# Patient Record
Sex: Male | Born: 1965 | Race: White | Hispanic: No | Marital: Married | Smoking: Never smoker
Health system: Southern US, Community
[De-identification: ages and names within clinical notes are randomized; demographics above are authoritative.]

## PROBLEM LIST (undated history)

## (undated) DIAGNOSIS — I10 Essential (primary) hypertension: Secondary | ICD-10-CM

## (undated) DIAGNOSIS — R05 Cough: Secondary | ICD-10-CM

## (undated) DIAGNOSIS — Z87442 Personal history of urinary calculi: Secondary | ICD-10-CM

## (undated) DIAGNOSIS — J45909 Unspecified asthma, uncomplicated: Secondary | ICD-10-CM

## (undated) DIAGNOSIS — F419 Anxiety disorder, unspecified: Secondary | ICD-10-CM

## (undated) DIAGNOSIS — K429 Umbilical hernia without obstruction or gangrene: Secondary | ICD-10-CM

## (undated) DIAGNOSIS — F988 Other specified behavioral and emotional disorders with onset usually occurring in childhood and adolescence: Secondary | ICD-10-CM

## (undated) DIAGNOSIS — K219 Gastro-esophageal reflux disease without esophagitis: Secondary | ICD-10-CM

## (undated) DIAGNOSIS — R059 Cough, unspecified: Secondary | ICD-10-CM

## (undated) HISTORY — PX: CYSTO: SHX6284

## (undated) HISTORY — PX: CARDIAC CATHETERIZATION: SHX172

## (undated) HISTORY — PX: WISDOM TOOTH EXTRACTION: SHX21

## (undated) HISTORY — PX: TONSILLECTOMY: SUR1361

## (undated) HISTORY — PX: COLONOSCOPY: SHX174

---
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
## 2009-03-05 HISTORY — PX: CARDIAC CATHETERIZATION: SHX172

## 2009-09-18 ENCOUNTER — Inpatient Hospital Stay (HOSPITAL_COMMUNITY): Admission: EM | Admit: 2009-09-18 | Discharge: 2009-09-19 | Payer: Self-pay | Admitting: Emergency Medicine

## 2010-05-20 LAB — DIFFERENTIAL
Lymphocytes Relative: 26 % (ref 12–46)
Monocytes Absolute: 0.6 10*3/uL (ref 0.1–1.0)
Monocytes Relative: 7 % (ref 3–12)
Neutro Abs: 5.2 10*3/uL (ref 1.7–7.7)
Neutrophils Relative %: 59 % (ref 43–77)

## 2010-05-20 LAB — HEMOGLOBIN A1C
Hgb A1c MFr Bld: 5.3 % (ref <5.7)
Mean Plasma Glucose: 105 mg/dL (ref <117)
Mean Plasma Glucose: 105 mg/dL (ref <117)

## 2010-05-20 LAB — COMPREHENSIVE METABOLIC PANEL
AST: 45 U/L — ABNORMAL HIGH (ref 0–37)
Alkaline Phosphatase: 48 U/L (ref 39–117)
Calcium: 9.4 mg/dL (ref 8.4–10.5)
GFR calc non Af Amer: >60 mL/min (ref >60)
Sodium: 135 mEq/L (ref 135–145)
Total Protein: 7.5 g/dL (ref 6.0–8.3)

## 2010-05-20 LAB — BASIC METABOLIC PANEL
CO2: 24 mEq/L (ref 19–32)
Calcium: 8.8 mg/dL (ref 8.4–10.5)
Chloride: 107 mEq/L (ref 96–112)
Glucose, Bld: 102 mg/dL — ABNORMAL HIGH (ref 70–99)
Potassium: 4 mEq/L (ref 3.5–5.1)
Sodium: 138 mEq/L (ref 135–145)

## 2010-05-20 LAB — CBC
HCT: 47.1 % (ref 39.0–52.0)
Hemoglobin: 14.6 g/dL (ref 13.0–17.0)
MCHC: 35.3 g/dL (ref 30.0–36.0)
Platelets: 213 10*3/uL (ref 150–400)
RBC: 5.05 MIL/uL (ref 4.22–5.81)
RDW: 13.2 % (ref 11.5–15.5)
WBC: 8.8 10*3/uL (ref 4.0–10.5)

## 2010-05-20 LAB — PROTIME-INR: INR: 0.96 (ref 0.00–1.49)

## 2010-05-20 LAB — CARDIAC PANEL(CRET KIN+CKTOT+MB+TROPI)
CK, MB: 1.4 ng/mL (ref 0.3–4.0)
Relative Index: 0.1 (ref 0.0–2.5)
Relative Index: 0.1 (ref 0.0–2.5)
Total CK: 1802 U/L — ABNORMAL HIGH (ref 7–232)
Troponin I: 0.01 ng/mL (ref 0.00–0.06)
Troponin I: 0.01 ng/mL (ref 0.00–0.06)

## 2010-05-20 LAB — MAGNESIUM: Magnesium: 2.2 mg/dL (ref 1.5–2.5)

## 2010-05-20 LAB — POCT CARDIAC MARKERS
CKMB, poc: 1 ng/mL (ref 1.0–8.0)
CKMB, poc: <1 ng/mL — ABNORMAL LOW (ref 1.0–8.0)
Myoglobin, poc: 310 ng/mL (ref 12–200)
Myoglobin, poc: 373 ng/mL (ref 12–200)
Troponin i, poc: <0.05 ng/mL (ref 0.00–0.09)

## 2010-05-20 LAB — MRSA PCR SCREENING: MRSA by PCR: NEGATIVE

## 2010-05-20 LAB — TROPONIN I: Troponin I: 0.01 ng/mL (ref 0.00–0.06)

## 2010-05-20 LAB — POCT I-STAT, CHEM 8
BUN: 20 mg/dL (ref 6–23)
Calcium, Ion: 1.3 mmol/L (ref 1.12–1.32)
Chloride: 103 mEq/L (ref 96–112)
Creatinine, Ser: 1.1 mg/dL (ref 0.4–1.5)
TCO2: 27 mmol/L (ref 0–100)

## 2010-05-20 LAB — HEPARIN LEVEL (UNFRACTIONATED): Heparin Unfractionated: 0.18 IU/mL — ABNORMAL LOW (ref 0.30–0.70)

## 2010-05-20 LAB — LIPASE, BLOOD: Lipase: 49 U/L (ref 11–59)

## 2010-05-21 ENCOUNTER — Emergency Department (HOSPITAL_COMMUNITY): Payer: Medicaid Other

## 2010-05-21 ENCOUNTER — Emergency Department (HOSPITAL_COMMUNITY)
Admission: EM | Admit: 2010-05-21 | Discharge: 2010-05-21 | Disposition: A | Discharge status: Home / Self Care | Payer: Medicaid Other | Attending: Emergency Medicine | Admitting: Emergency Medicine

## 2010-05-21 DIAGNOSIS — R319 Hematuria, unspecified: Secondary | ICD-10-CM | POA: Insufficient documentation

## 2010-05-21 DIAGNOSIS — R339 Retention of urine, unspecified: Secondary | ICD-10-CM | POA: Insufficient documentation

## 2010-05-21 DIAGNOSIS — Z87442 Personal history of urinary calculi: Secondary | ICD-10-CM | POA: Insufficient documentation

## 2010-05-21 DIAGNOSIS — R10819 Abdominal tenderness, unspecified site: Secondary | ICD-10-CM | POA: Insufficient documentation

## 2010-05-21 LAB — POCT I-STAT, CHEM 8
BUN: 16 mg/dL (ref 6–23)
Calcium, Ion: 1.24 mmol/L (ref 1.12–1.32)
HCT: 48 % (ref 39.0–52.0)
Hemoglobin: 16.3 g/dL (ref 13.0–17.0)
Sodium: 143 mEq/L (ref 135–145)
TCO2: 25 mmol/L (ref 0–100)

## 2010-05-21 LAB — URINALYSIS, ROUTINE W REFLEX MICROSCOPIC
Bilirubin Urine: NEGATIVE
Ketones, ur: NEGATIVE mg/dL
Nitrite: NEGATIVE
pH: 6 (ref 5.0–8.0)

## 2010-05-21 LAB — URINE MICROSCOPIC-ADD ON

## 2010-05-22 LAB — URINE CULTURE: Culture  Setup Time: 201203181923

## 2010-05-25 ENCOUNTER — Encounter (HOSPITAL_COMMUNITY): Payer: Medicaid Other

## 2010-05-25 ENCOUNTER — Other Ambulatory Visit: Payer: Self-pay | Admitting: Urology

## 2010-05-25 DIAGNOSIS — Z01812 Encounter for preprocedural laboratory examination: Secondary | ICD-10-CM | POA: Insufficient documentation

## 2010-05-27 ENCOUNTER — Ambulatory Visit (HOSPITAL_COMMUNITY)
Admission: RE | Admit: 2010-05-27 | Discharge: 2010-05-27 | Disposition: A | Discharge status: Home / Self Care | Payer: Medicaid Other | Source: Ambulatory Visit | Attending: Urology | Admitting: Urology

## 2010-05-27 DIAGNOSIS — N201 Calculus of ureter: Secondary | ICD-10-CM | POA: Insufficient documentation

## 2010-05-27 DIAGNOSIS — Z01812 Encounter for preprocedural laboratory examination: Secondary | ICD-10-CM | POA: Insufficient documentation

## 2010-05-27 DIAGNOSIS — N210 Calculus in bladder: Secondary | ICD-10-CM | POA: Insufficient documentation

## 2010-05-29 ENCOUNTER — Ambulatory Visit (HOSPITAL_COMMUNITY): Payer: Medicaid Other

## 2010-05-29 ENCOUNTER — Ambulatory Visit (HOSPITAL_COMMUNITY)
Admission: RE | Admit: 2010-05-29 | Discharge: 2010-05-29 | Disposition: A | Discharge status: Home / Self Care | Payer: Medicaid Other | Source: Ambulatory Visit | Attending: Urology | Admitting: Urology

## 2010-05-29 DIAGNOSIS — N200 Calculus of kidney: Secondary | ICD-10-CM | POA: Insufficient documentation

## 2010-05-31 NOTE — Op Note (Signed)
  [name], [name]              ACCOUNT NO.:  [id number]  MEDICAL RECORD NO.:  [id number]           PATIENT TYPE:  O  LOCATION:  PADM                         FACILITY:  [facility]  PHYSICIAN:  Florabel Faulks I. Patsi Sears, M.D.DATE OF BIRTH:  Nov 09, 1965  DATE OF PROCEDURE: DATE OF DISCHARGE:                              OPERATIVE REPORT   PREOPERATIVE DIAGNOSES:  Bladder stone and right ureteropelvic junction stone.  POSTOPERATIVE DIAGNOSES:  Bladder stone and right ureteropelvic junction stone.  OPERATION: 1. Cystourethroscopy. 2. Holmium laser cystolithalopaxy. 3. Right retrograde pyelogram with interpretation. 4. Right double-J stent (6-French x 26 cm).  PREPARATION:  After appropriate preanesthesia, the patient was brought to the operating room, placed on the operating table in dorsal supine position where general LMA anesthesia was introduced.  He was then replaced in dorsal lithotomy position where the pubis was prepped with Betadine solution and draped in usual fashion.  REVIEW OF HISTORY:  The patient is a 44 year old male, with a 1 cm distal ureteral stone, which was moved into the bladder, causing urethral obstruction, and an 18 mm right UPJ stone with proximal hydronephrosis.  He is now for removal of the bladder stone, and retrograde pyelogram and double-J stent.  DESCRIPTION OF PROCEDURE:  Cystourethroscopy was accomplished and stone was identified within the bladder.  Holmium laser was used to break up the stone, and this was removed in pieces.  Following this, retrograde pyelogram was performed on the right side, which was previously marked. Retrograde pyelogram showed a large stone in the right UPJ.  Guidewire was passed around the stone into the renal pelvis, and 6 x 26 cm double- J stent was placed.  The patient tolerated the procedure well.  He was then awakened and taken to recovery room in good condition.  He had B and O suppository.  He did not have Toradol  because he is due for lithotripsy on Monday.     Vicki Chaffin I. Patsi Sears, M.D.     SIT/MEDQ  D:  05/27/2010  T:  05/27/2010  Job:  [id number]  Electronically Signed by Jethro Bolus M.D. on 05/31/2010 09:39:55 AM

## 2010-08-26 ENCOUNTER — Emergency Department (HOSPITAL_BASED_OUTPATIENT_CLINIC_OR_DEPARTMENT_OTHER)
Admission: EM | Admit: 2010-08-26 | Discharge: 2010-08-26 | Disposition: A | Discharge status: Home / Self Care | Payer: Self-pay | Attending: Emergency Medicine | Admitting: Emergency Medicine

## 2010-08-26 DIAGNOSIS — Z203 Contact with and (suspected) exposure to rabies: Secondary | ICD-10-CM | POA: Insufficient documentation

## 2010-08-26 DIAGNOSIS — Y9289 Other specified places as the place of occurrence of the external cause: Secondary | ICD-10-CM | POA: Insufficient documentation

## 2010-08-26 DIAGNOSIS — Z23 Encounter for immunization: Secondary | ICD-10-CM | POA: Insufficient documentation

## 2010-08-29 ENCOUNTER — Inpatient Hospital Stay (INDEPENDENT_AMBULATORY_CARE_PROVIDER_SITE_OTHER)
Admission: RE | Admit: 2010-08-29 | Discharge: 2010-08-29 | Disposition: A | Discharge status: Home / Self Care | Payer: Self-pay | Source: Ambulatory Visit | Attending: Family Medicine | Admitting: Family Medicine

## 2010-08-29 DIAGNOSIS — Z23 Encounter for immunization: Secondary | ICD-10-CM

## 2010-09-02 ENCOUNTER — Inpatient Hospital Stay
Admission: RE | Admit: 2010-09-02 | Discharge: 2010-09-02 | Disposition: A | Discharge status: Home / Self Care | Payer: Self-pay | Source: Ambulatory Visit

## 2010-09-02 ENCOUNTER — Encounter (INDEPENDENT_AMBULATORY_CARE_PROVIDER_SITE_OTHER): Payer: Self-pay | Admitting: *Deleted

## 2010-09-09 ENCOUNTER — Inpatient Hospital Stay (INDEPENDENT_AMBULATORY_CARE_PROVIDER_SITE_OTHER)
Admission: RE | Admit: 2010-09-09 | Discharge: 2010-09-09 | Disposition: A | Discharge status: Home / Self Care | Payer: Self-pay | Source: Ambulatory Visit

## 2010-09-09 ENCOUNTER — Encounter (INDEPENDENT_AMBULATORY_CARE_PROVIDER_SITE_OTHER): Payer: Self-pay | Admitting: Emergency Medicine

## 2010-09-09 DIAGNOSIS — Z23 Encounter for immunization: Secondary | ICD-10-CM

## 2010-12-07 ENCOUNTER — Other Ambulatory Visit (HOSPITAL_COMMUNITY): Payer: Self-pay | Admitting: Urology

## 2010-12-07 DIAGNOSIS — N200 Calculus of kidney: Secondary | ICD-10-CM

## 2010-12-21 ENCOUNTER — Ambulatory Visit (HOSPITAL_COMMUNITY)
Admission: RE | Admit: 2010-12-21 | Discharge: 2010-12-21 | Disposition: A | Discharge status: Home / Self Care | Payer: Self-pay | Source: Ambulatory Visit | Attending: Urology | Admitting: Urology

## 2010-12-21 DIAGNOSIS — K429 Umbilical hernia without obstruction or gangrene: Secondary | ICD-10-CM | POA: Insufficient documentation

## 2010-12-21 DIAGNOSIS — N200 Calculus of kidney: Secondary | ICD-10-CM | POA: Insufficient documentation

## 2010-12-21 DIAGNOSIS — K4090 Unilateral inguinal hernia, without obstruction or gangrene, not specified as recurrent: Secondary | ICD-10-CM | POA: Insufficient documentation

## 2010-12-21 DIAGNOSIS — K7689 Other specified diseases of liver: Secondary | ICD-10-CM | POA: Insufficient documentation

## 2011-02-05 NOTE — Assessment & Plan Note (Signed)
Summary: RABIES VACCINE  Nurse Visit   Vital Signs:  Patient profile:   45 year old male Height:      72 inches Weight:      224 pounds O2 Sat:      98 % on Room air Pulse rate:   82 / minute Resp:     18 per minute BP sitting:   128 / 93  (left arm) Cuff size:   regular  Vitals Entered By: Linton Flemings RN (September 02, 2010 1:50 PM)  O2 Flow:  Room air  Immunizations Administered:  Rabies Vaccine # 3:    Vaccine Type: rabavert    Site: left deltoid    Mfr: novartis    Dose: 0.1 ml    Route: IM    Given by: Linton Flemings, RN    Exp. Date: 07/04/2011    Lot #: [id number]    VIS given: 03/16/04 version given September 04, 2010.   Immunizations Administered:  Rabies Vaccine # 3:    Vaccine Type: rabavert    Site: left deltoid    Mfr: novartis    Dose: 0.1 ml    Route: IM    Given by: Linton Flemings, RN    Exp. Date: 07/04/2011    Lot #: [id number]    VIS given: 03/16/04 version given September 04, 2010.

## 2011-02-05 NOTE — Assessment & Plan Note (Signed)
Summary: rabies vaccination  Nurse Visit   Vital Signs:  Patient profile:   45 year old male Weight:      226 pounds O2 Sat:      94 % Temp:     97.9 degrees F oral Pulse rate:   85 / minute Resp:     16 per minute BP sitting:   111 / 79  (left arm) Cuff size:   large  Vitals Entered By: Lavell Islam RN (September 09, 2010 5:45 PM)  Immunizations Administered:  Rabies Vaccine # 3:    Vaccine Type: Rabies    Site: right deltoid    Mfr: rabvert    Dose: 1.0 ml    Route: IM    Given by: Lavell Islam RN    Exp. Date: 02/02/2014    Lot #: [id number]  Orders Added: 1)  Rabies [90675] 2)  Admin 1st Vaccine [90471]   Immunizations Administered:  Rabies Vaccine # 3:    Vaccine Type: Rabies    Site: right deltoid    Mfr: rabvert    Dose: 1.0 ml    Route: IM    Given by: Lavell Islam RN    Exp. Date: 02/02/2014    Lot #: [id number]  Appended Document: rabies vaccination Rabies Vaccine #4 (not #3) given on 09-09-2010.

## 2014-01-20 ENCOUNTER — Encounter (INDEPENDENT_AMBULATORY_CARE_PROVIDER_SITE_OTHER): Payer: Self-pay | Admitting: General Surgery

## 2014-01-20 NOTE — Progress Notes (Signed)
Patient ID: Alexander Stanley, male   DOB: 07/05/1965, 48 y.o.   MRN: [id number]   Alexander Stanley 01/20/2014 9:37 AM Location: [facility] Patient #: [id number] DOB: 08/28/1965 Married / Language: English / Race: White Male History of Present Illness (Bing Duffey J. Sharie Amorin MD; 01/20/2014 11:29 AM) Patient words: umb hernia.  The patient is a 48 year old male    Note:He is self-referred and comes in because of a painful umbilical hernia. He noticed a small bulge in the umbilical area about 2-1/2 years ago. Recently, it has become larger and progressively more uncomfortable. It is interfering with his lifestyle and his ability to do things he wants to do. No obstructive symptoms. He is interested in having the hernia repaired but is concerned about mesh being used given the complications he has heard about.  Other Problems ([name], CMA; 01/20/2014 9:37 AM) Anxiety Disorder Asthma Back Pain Gastroesophageal Reflux Disease Hemorrhoids High blood pressure Kidney Stone Umbilical Hernia Repair  Past Surgical History ([name], CMA; 01/20/2014 9:37 AM) Oral Surgery Tonsillectomy  Diagnostic Studies History ([name], CMA; 01/20/2014 9:37 AM) Colonoscopy never  Allergies ([name], CMA; 01/20/2014 9:39 AM) Effexor *ANTIDEPRESSANTS*  Medication History ([name], CMA; 01/20/2014 9:40 AM) Lisinopril (10MG Tablet, Oral) Active. Hydrochlorothiazide (12.5MG Capsule, Oral) Active.  Social History ([name], CMA; 01/20/2014 9:37 AM) Caffeine use Carbonated beverages. No alcohol use No drug use Tobacco use Never smoker.  Family History ([name], CMA; 01/20/2014 9:37 AM) Depression Father. Diabetes Mellitus Father, Mother. Heart Disease Father. Hypertension Father. Melanoma Father.     Review of Systems ([name] CMA; 01/20/2014 9:37 AM) General Present- Fatigue and Weight Gain. Not  Present- Appetite Loss, Chills, Fever, Night Sweats and Weight Loss. Skin Not Present- Change in Wart/Mole, Dryness, Hives, Jaundice, New Lesions, Non-Healing Wounds, Rash and Ulcer. HEENT Present- Seasonal Allergies and Wears glasses/contact lenses. Not Present- Earache, Hearing Loss, Hoarseness, Nose Bleed, Oral Ulcers, Ringing in the Ears, Sinus Pain, Sore Throat, Visual Disturbances and Yellow Eyes. Respiratory Not Present- Bloody sputum, Chronic Cough, Difficulty Breathing, Snoring and Wheezing. Breast Not Present- Breast Mass, Breast Pain, Nipple Discharge and Skin Changes. Cardiovascular Not Present- Chest Pain, Difficulty Breathing Lying Down, Leg Cramps, Palpitations, Rapid Heart Rate, Shortness of Breath and Swelling of Extremities. Gastrointestinal Present- Abdominal Pain and Bloating. Not Present- Bloody Stool, Change in Bowel Habits, Chronic diarrhea, Constipation, Difficulty Swallowing, Excessive gas, Gets full quickly at meals, Hemorrhoids, Indigestion, Nausea, Rectal Pain and Vomiting. Male Genitourinary Not Present- Blood in Urine, Change in Urinary Stream, Frequency, Impotence, Nocturia, Painful Urination, Urgency and Urine Leakage. Musculoskeletal Not Present- Back Pain, Joint Pain, Joint Stiffness, Muscle Pain, Muscle Weakness and Swelling of Extremities. Neurological Not Present- Decreased Memory, Fainting, Headaches, Numbness, Seizures, Tingling, Tremor, Trouble walking and Weakness. Psychiatric Not Present- Anxiety, Bipolar, Change in Sleep Pattern, Depression, Fearful and Frequent crying. Endocrine Not Present- Cold Intolerance, Excessive Hunger, Hair Changes, Heat Intolerance, Hot flashes and New Diabetes. Hematology Not Present- Easy Bruising, Excessive bleeding, Gland problems, HIV and Persistent Infections.  Vitals ([name] CMA; 01/20/2014 9:41 AM) 01/20/2014 9:41 AM Weight: 240.13 lb Height: 72in Body Surface Area: 2.35 m Body Mass Index: 32.57  kg/m Temp.: 98.2F  Pulse: 72 (Regular)  BP: 130/100 (Sitting, Left Arm, Standard)     Physical Exam (Deep Bonawitz J. Gracelin Weisberg MD; 01/20/2014 11:32 AM)  The physical exam findings are as follows: Note:General: Overweight male in NAD. Pleasant and cooperative.  HEENT: [place]/AT, no facial masses  CV: RRR, no murmur, no JVD.    CHEST: Breath sounds equal and clear. Respirations nonlabored..  ABDOMEN: Soft, nontender, nondistended, moderate size umbilical bulge that is reducible with a palpable 1-1.5 cm fascial defect  GU: No bulges.  NEUROLOGIC: Alert and oriented, answers questions appropriately, normal gait and station.  PSYCHIATRIC: Normal mood, affect , and behavior.    Assessment & Plan [name](Tallin Hart J. Ludie Hudon MD; 01/20/2014 11:34 AM)  UMBILICAL HERNIA WITHOUT OBSTRUCTION AND WITHOUT GANGRENE (553.1  K42.9) Impression: The hernia is symptomatic and lifestyle limiting and he would like to have it repaired. We had a long discussion about mesh and the significant advantages it offers with respect to decreased recurrence rate. Given his body size, I estimate his recurrence without mesh could be as high as 40% and I discussed this with him.  Plan: I recommended umbilical hernia repair with mesh as an outpatient. If he does not want to have the mesh, he understands the significant increase in recurrence rate. I have discussed the procedure, risks, and aftercare. Risks include but are not limited to bleeding, infection, wound healing problems, anesthesia, recurrence, accidental injury to intra-abdominal organs-such. We also discussed the rare complication of mesh rejection. All questions were answered.  Avel Peaceodd Sadiel Mota

## 2014-01-20 NOTE — ED Notes (Signed)
Call from [facility], asking for medical notes from last visit. Last note 2012, not applicable

## 2014-02-17 ENCOUNTER — Encounter (HOSPITAL_COMMUNITY)
Admission: RE | Admit: 2014-02-17 | Discharge: 2014-02-17 | Disposition: A | Discharge status: Home / Self Care | Payer: 59 | Source: Ambulatory Visit | Attending: General Surgery | Admitting: General Surgery

## 2014-02-17 ENCOUNTER — Encounter (HOSPITAL_COMMUNITY): Payer: Self-pay

## 2014-02-17 ENCOUNTER — Ambulatory Visit (HOSPITAL_COMMUNITY)
Admission: RE | Admit: 2014-02-17 | Discharge: 2014-02-17 | Disposition: A | Discharge status: Home / Self Care | Payer: 59 | Source: Ambulatory Visit | Attending: Anesthesiology | Admitting: Anesthesiology

## 2014-02-17 DIAGNOSIS — K429 Umbilical hernia without obstruction or gangrene: Secondary | ICD-10-CM | POA: Insufficient documentation

## 2014-02-17 DIAGNOSIS — J45909 Unspecified asthma, uncomplicated: Secondary | ICD-10-CM | POA: Insufficient documentation

## 2014-02-17 DIAGNOSIS — I10 Essential (primary) hypertension: Secondary | ICD-10-CM | POA: Insufficient documentation

## 2014-02-17 DIAGNOSIS — Z01818 Encounter for other preprocedural examination: Secondary | ICD-10-CM | POA: Insufficient documentation

## 2014-02-17 DIAGNOSIS — Z01812 Encounter for preprocedural laboratory examination: Secondary | ICD-10-CM | POA: Diagnosis not present

## 2014-02-17 HISTORY — DX: Other specified behavioral and emotional disorders with onset usually occurring in childhood and adolescence: F98.8

## 2014-02-17 HISTORY — DX: Essential (primary) hypertension: I10

## 2014-02-17 HISTORY — DX: Umbilical hernia without obstruction or gangrene: K42.9

## 2014-02-17 HISTORY — DX: Unspecified asthma, uncomplicated: J45.909

## 2014-02-17 HISTORY — DX: Anxiety disorder, unspecified: F41.9

## 2014-02-17 HISTORY — DX: Cough, unspecified: R05.9

## 2014-02-17 HISTORY — DX: Personal history of urinary calculi: Z87.442

## 2014-02-17 HISTORY — DX: Gastro-esophageal reflux disease without esophagitis: K21.9

## 2014-02-17 HISTORY — DX: Cough: R05

## 2014-02-17 LAB — CBC WITH DIFFERENTIAL/PLATELET
BASOS PCT: 1 % (ref 0–1)
Basophils Absolute: 0 10*3/uL (ref 0.0–0.1)
Eosinophils Absolute: 0.3 10*3/uL (ref 0.0–0.7)
Eosinophils Relative: 5 % (ref 0–5)
HCT: 49 % (ref 39.0–52.0)
Hemoglobin: 16 g/dL (ref 13.0–17.0)
Lymphocytes Relative: 26 % (ref 12–46)
Lymphs Abs: 1.9 10*3/uL (ref 0.7–4.0)
MCH: 27.9 pg (ref 26.0–34.0)
MCHC: 32.7 g/dL (ref 30.0–36.0)
MCV: 85.4 fL (ref 78.0–100.0)
Monocytes Absolute: 0.5 10*3/uL (ref 0.1–1.0)
Monocytes Relative: 6 % (ref 3–12)
NEUTROS ABS: 4.4 10*3/uL (ref 1.7–7.7)
NEUTROS PCT: 62 % (ref 43–77)
PLATELETS: 217 10*3/uL (ref 150–400)
RBC: 5.74 MIL/uL (ref 4.22–5.81)
RDW: 13 % (ref 11.5–15.5)
WBC: 7.1 10*3/uL (ref 4.0–10.5)

## 2014-02-17 LAB — COMPREHENSIVE METABOLIC PANEL
ALT: 38 U/L (ref 0–53)
ANION GAP: 11 (ref 5–15)
AST: 24 U/L (ref 0–37)
Albumin: 4.5 g/dL (ref 3.5–5.2)
Alkaline Phosphatase: 56 U/L (ref 39–117)
BUN: 20 mg/dL (ref 6–23)
CALCIUM: 9.9 mg/dL (ref 8.4–10.5)
CO2: 29 meq/L (ref 19–32)
Chloride: 96 mEq/L (ref 96–112)
Creatinine, Ser: 1.08 mg/dL (ref 0.50–1.35)
GFR, EST NON AFRICAN AMERICAN: 79 mL/min — AB (ref >90)
GLUCOSE: 106 mg/dL — AB (ref 70–99)
Potassium: 4.3 mEq/L (ref 3.7–5.3)
SODIUM: 136 meq/L — AB (ref 137–147)
TOTAL PROTEIN: 8.2 g/dL (ref 6.0–8.3)
Total Bilirubin: 0.6 mg/dL (ref 0.3–1.2)

## 2014-02-17 LAB — PROTIME-INR
INR: 0.98 (ref 0.00–1.49)
Prothrombin Time: 13 seconds (ref 11.6–15.2)

## 2014-02-17 NOTE — Patient Instructions (Signed)
Alexander Stanley  02/17/2014   Your procedure is scheduled on:  02/19/14    [facility]  Entrance and follow signs to               Short Stay Center at  8:20  AM.  Call this number if you have problems the morning of surgery [phone]   Remember:  Do not eat food or drink liquids :After Midnight.     Take these medicines the morning of surgery with A SIP OF WATER:  PROTONIX / MAY TAKE VALIUM / ROBITUSSIN IF NEEDED               STOP ASPIRIN / HERBAL MEDS / ALEVE [place]/IBUPROFEN / MOTRIN 7 DAYS PREOP                               You may not have any metal on your body including hair pins and              piercings  Do not wear jewelry, make-up, lotions, powders or perfumes.             Do not wear nail polish.  Do not shave  48 hours prior to surgery.              Men may shave face and neck.   Do not bring valuables to the hospital. [facility] IS NOT             RESPONSIBLE   FOR VALUABLES.  Contacts, dentures or bridgework may not be worn into surgery.  Leave suitcase in the car. After surgery it may be brought to your room.     Patients discharged the day of surgery will not be allowed to drive home.  Name and phone number of your driver:  Special Instructions: N/A              Please read over the following fact sheets you were given: _____________________________________________________________________                                                     [place] - PREPARING FOR SURGERY  Before surgery, you can play an important role.  Because skin is not sterile, your skin needs to be as free of germs as possible.  You can reduce the number of germs on your skin by washing with CHG (chlorahexidine gluconate) soap before surgery.  CHG is an antiseptic cleaner which kills germs and bonds with the skin to continue killing germs even after washing. Please DO NOT use if you have an allergy to CHG or antibacterial soaps.  If your skin becomes  reddened/irritated stop using the CHG and inform your nurse when you arrive at Short Stay. Do not shave (including legs and underarms) for at least 48 hours prior to the first CHG shower.  You may shave your face. Please follow these instructions carefully:   1.  Shower with CHG Soap the night before surgery and the  morning of Surgery.   2.  If you choose to wash your hair, wash your hair first as usual with your  normal  Shampoo.   3.  After you shampoo, rinse  your hair and body thoroughly to remove the  shampoo.                                         4.  Use CHG as you would any other liquid soap.  You can apply chg directly  to the skin and wash . Gently wash with scrungie or clean wascloth    5.  Apply the CHG Soap to your body ONLY FROM THE NECK DOWN.   Do not use on open                           Wound or open sores. Avoid contact with eyes, ears mouth and genitals (private parts).                        Genitals (private parts) with your normal soap.              6.  Wash thoroughly, paying special attention to the area where your surgery  will be performed.   7.  Thoroughly rinse your body with warm water from the neck down.   8.  DO NOT shower/wash with your normal soap after using and rinsing off  the CHG Soap .                9.  Pat yourself dry with a clean towel.             10.  Wear clean pajamas.             11.  Place clean sheets on your bed the night of your first shower and do not  sleep with pets.  Day of Surgery : Do not apply any lotions/deodorants the morning of surgery.  Please wear clean clothes to the hospital/surgery center.  FAILURE TO FOLLOW THESE INSTRUCTIONS MAY RESULT IN THE CANCELLATION OF YOUR SURGERY    PATIENT SIGNATURE_________________________________  ______________________________________________________________________

## 2014-02-19 ENCOUNTER — Encounter (HOSPITAL_COMMUNITY): Payer: Self-pay | Admitting: *Deleted

## 2014-02-19 ENCOUNTER — Ambulatory Visit (HOSPITAL_COMMUNITY): Payer: 59 | Admitting: Anesthesiology

## 2014-02-19 ENCOUNTER — Ambulatory Visit (HOSPITAL_COMMUNITY)
Admission: RE | Admit: 2014-02-19 | Discharge: 2014-02-19 | Disposition: A | Discharge status: Home / Self Care | Payer: 59 | Source: Ambulatory Visit | Attending: General Surgery | Admitting: General Surgery

## 2014-02-19 ENCOUNTER — Encounter (HOSPITAL_COMMUNITY)
Admission: RE | Disposition: A | Discharge status: Home / Self Care | Payer: Self-pay | Source: Ambulatory Visit | Attending: General Surgery

## 2014-02-19 DIAGNOSIS — Z87442 Personal history of urinary calculi: Secondary | ICD-10-CM | POA: Diagnosis not present

## 2014-02-19 DIAGNOSIS — J45909 Unspecified asthma, uncomplicated: Secondary | ICD-10-CM | POA: Diagnosis not present

## 2014-02-19 DIAGNOSIS — K219 Gastro-esophageal reflux disease without esophagitis: Secondary | ICD-10-CM | POA: Insufficient documentation

## 2014-02-19 DIAGNOSIS — I4510 Unspecified right bundle-branch block: Secondary | ICD-10-CM | POA: Diagnosis not present

## 2014-02-19 DIAGNOSIS — K429 Umbilical hernia without obstruction or gangrene: Secondary | ICD-10-CM | POA: Diagnosis not present

## 2014-02-19 DIAGNOSIS — I10 Essential (primary) hypertension: Secondary | ICD-10-CM | POA: Diagnosis not present

## 2014-02-19 HISTORY — PX: INSERTION OF MESH: SHX5868

## 2014-02-19 HISTORY — PX: UMBILICAL HERNIA REPAIR: SHX196

## 2014-02-19 SURGERY — REPAIR, HERNIA, UMBILICAL, ADULT
Anesthesia: General | Site: Abdomen

## 2014-02-19 MED ORDER — ONDANSETRON HCL 4 MG/2ML IJ SOLN
INTRAMUSCULAR | Status: AC
  Filled 2014-02-19: qty 2

## 2014-02-19 MED ORDER — BUPIVACAINE-EPINEPHRINE (PF) 0.5% -1:200000 IJ SOLN
INTRAMUSCULAR | Status: AC
  Filled 2014-02-19: qty 30

## 2014-02-19 MED ORDER — OXYCODONE HCL 5 MG PO TABS: 5.0000 mg | ORAL_TABLET | ORAL | Status: DC | PRN

## 2014-02-19 MED ORDER — MORPHINE SULFATE 10 MG/ML IJ SOLN: 2.0000 mg | INTRAMUSCULAR | Status: DC | PRN

## 2014-02-19 MED ORDER — GLYCOPYRROLATE 0.2 MG/ML IJ SOLN
INTRAMUSCULAR | Status: DC | PRN
  Administered 2014-02-19: 0.6 mg via INTRAVENOUS

## 2014-02-19 MED ORDER — OXYCODONE HCL 5 MG PO TABS
5.0000 mg | ORAL_TABLET | ORAL | Status: DC | PRN
  Administered 2014-02-19 (×2): 5 mg via ORAL
  Filled 2014-02-19 (×2): qty 1

## 2014-02-19 MED ORDER — PROPOFOL 10 MG/ML IV BOLUS
INTRAVENOUS | Status: DC | PRN
  Administered 2014-02-19: 150 mg via INTRAVENOUS

## 2014-02-19 MED ORDER — LACTATED RINGERS IV SOLN: INTRAVENOUS | Status: DC

## 2014-02-19 MED ORDER — PHENYLEPHRINE 40 MCG/ML (10ML) SYRINGE FOR IV PUSH (FOR BLOOD PRESSURE SUPPORT)
PREFILLED_SYRINGE | INTRAVENOUS | Status: AC
  Filled 2014-02-19: qty 10

## 2014-02-19 MED ORDER — DEXAMETHASONE SODIUM PHOSPHATE 10 MG/ML IJ SOLN
INTRAMUSCULAR | Status: DC | PRN
  Administered 2014-02-19: 10 mg via INTRAVENOUS

## 2014-02-19 MED ORDER — ACETAMINOPHEN 325 MG PO TABS: 650.0000 mg | ORAL_TABLET | ORAL | Status: DC | PRN

## 2014-02-19 MED ORDER — NEOSTIGMINE METHYLSULFATE 10 MG/10ML IV SOLN
INTRAVENOUS | Status: DC | PRN
  Administered 2014-02-19: 4 mg via INTRAVENOUS

## 2014-02-19 MED ORDER — PROPOFOL 10 MG/ML IV BOLUS
INTRAVENOUS | Status: AC
  Filled 2014-02-19: qty 20

## 2014-02-19 MED ORDER — BUPIVACAINE-EPINEPHRINE 0.5% -1:200000 IJ SOLN
INTRAMUSCULAR | Status: DC | PRN
  Administered 2014-02-19: 19 mL

## 2014-02-19 MED ORDER — ACETAMINOPHEN 650 MG RE SUPP
650.0000 mg | RECTAL | Status: DC | PRN
  Filled 2014-02-19: qty 1

## 2014-02-19 MED ORDER — PHENYLEPHRINE HCL 10 MG/ML IJ SOLN
INTRAMUSCULAR | Status: DC | PRN
  Administered 2014-02-19: 80 ug via INTRAVENOUS

## 2014-02-19 MED ORDER — GLYCOPYRROLATE 0.2 MG/ML IJ SOLN
INTRAMUSCULAR | Status: AC
  Filled 2014-02-19: qty 3

## 2014-02-19 MED ORDER — CEFAZOLIN SODIUM-DEXTROSE 2-3 GM-% IV SOLR
2.0000 g | INTRAVENOUS | Status: AC
  Administered 2014-02-19: 2 g via INTRAVENOUS

## 2014-02-19 MED ORDER — ROCURONIUM BROMIDE 100 MG/10ML IV SOLN
INTRAVENOUS | Status: AC
  Filled 2014-02-19: qty 1

## 2014-02-19 MED ORDER — SUCCINYLCHOLINE CHLORIDE 20 MG/ML IJ SOLN
INTRAMUSCULAR | Status: DC | PRN
  Administered 2014-02-19: 100 mg via INTRAVENOUS

## 2014-02-19 MED ORDER — FENTANYL CITRATE 0.05 MG/ML IJ SOLN
INTRAMUSCULAR | Status: DC | PRN
  Administered 2014-02-19: 100 ug via INTRAVENOUS
  Administered 2014-02-19: 25 ug via INTRAVENOUS

## 2014-02-19 MED ORDER — SODIUM CHLORIDE 0.9 % IJ SOLN: 3.0000 mL | Freq: Two times a day (BID) | INTRAMUSCULAR | Status: DC

## 2014-02-19 MED ORDER — MIDAZOLAM HCL 2 MG/2ML IJ SOLN
INTRAMUSCULAR | Status: AC
  Filled 2014-02-19: qty 2

## 2014-02-19 MED ORDER — FENTANYL CITRATE 0.05 MG/ML IJ SOLN
INTRAMUSCULAR | Status: AC
  Filled 2014-02-19: qty 5

## 2014-02-19 MED ORDER — ROCURONIUM BROMIDE 100 MG/10ML IV SOLN
INTRAVENOUS | Status: DC | PRN
  Administered 2014-02-19: 20 mg via INTRAVENOUS

## 2014-02-19 MED ORDER — DEXAMETHASONE SODIUM PHOSPHATE 10 MG/ML IJ SOLN
INTRAMUSCULAR | Status: AC
  Filled 2014-02-19: qty 1

## 2014-02-19 MED ORDER — CEFAZOLIN SODIUM-DEXTROSE 2-3 GM-% IV SOLR
INTRAVENOUS | Status: AC
  Filled 2014-02-19: qty 50

## 2014-02-19 MED ORDER — SODIUM CHLORIDE 0.9 % IJ SOLN: 3.0000 mL | INTRAMUSCULAR | Status: DC | PRN

## 2014-02-19 MED ORDER — HYDROMORPHONE HCL 1 MG/ML IJ SOLN: 0.2500 mg | INTRAMUSCULAR | Status: DC | PRN

## 2014-02-19 MED ORDER — SODIUM CHLORIDE 0.9 % IV SOLN: 250.0000 mL | INTRAVENOUS | Status: DC | PRN

## 2014-02-19 MED ORDER — ONDANSETRON HCL 4 MG/2ML IJ SOLN
INTRAMUSCULAR | Status: DC | PRN
  Administered 2014-02-19: 4 mg via INTRAVENOUS

## 2014-02-19 MED ORDER — LACTATED RINGERS IV SOLN
INTRAVENOUS | Status: DC
  Administered 2014-02-19: 1000 mL via INTRAVENOUS

## 2014-02-19 MED ORDER — MIDAZOLAM HCL 5 MG/5ML IJ SOLN
INTRAMUSCULAR | Status: DC | PRN
  Administered 2014-02-19: 2 mg via INTRAVENOUS

## 2014-02-19 SURGICAL SUPPLY — 38 items
BENZOIN TINCTURE PRP APPL 2/3 (GAUZE/BANDAGES/DRESSINGS) ×3 IMPLANT
BLADE HEX COATED 2.75 (ELECTRODE) ×3 IMPLANT
BLADE SURG 15 STRL LF DISP TIS (BLADE) ×1 IMPLANT
BLADE SURG 15 STRL SS (BLADE) ×2
CHLORAPREP W/TINT 26ML (MISCELLANEOUS) ×3 IMPLANT
CLOSURE WOUND 1/2 X4 (GAUZE/BANDAGES/DRESSINGS) ×1
CLOSURE WOUND 1/4X4 (GAUZE/BANDAGES/DRESSINGS) ×1
DECANTER SPIKE VIAL GLASS SM (MISCELLANEOUS) ×3 IMPLANT
DRAPE INCISE IOBAN 66X45 STRL (DRAPES) ×3 IMPLANT
DRAPE LAPAROTOMY T 102X78X121 (DRAPES) ×3 IMPLANT
DRSG TEGADERM 4X4.75 (GAUZE/BANDAGES/DRESSINGS) ×3 IMPLANT
ELECT REM PT RETURN 9FT ADLT (ELECTROSURGICAL) ×3
ELECTRODE REM PT RTRN 9FT ADLT (ELECTROSURGICAL) ×1 IMPLANT
GAUZE SPONGE 4X4 12PLY STRL (GAUZE/BANDAGES/DRESSINGS) ×3 IMPLANT
GLOVE ECLIPSE 8.0 STRL XLNG CF (GLOVE) ×3 IMPLANT
GLOVE INDICATOR 8.0 STRL GRN (GLOVE) ×3 IMPLANT
GOWN STRL REUS W/TWL XL LVL3 (GOWN DISPOSABLE) ×6 IMPLANT
KIT BASIN OR (CUSTOM PROCEDURE TRAY) ×3 IMPLANT
MESH HERNIA 6X6 BARD (Mesh General) ×1 IMPLANT
MESH HERNIA BARD 6X6 (Mesh General) ×2 IMPLANT
NEEDLE HYPO 25X1 1.5 SAFETY (NEEDLE) ×3 IMPLANT
NS IRRIG 1000ML POUR BTL (IV SOLUTION) ×3 IMPLANT
PACK BASIC VI WITH GOWN DISP (CUSTOM PROCEDURE TRAY) ×3 IMPLANT
PENCIL BUTTON HOLSTER BLD 10FT (ELECTRODE) ×3 IMPLANT
SOL PREP POV-IOD 4OZ 10% (MISCELLANEOUS) IMPLANT
SPONGE LAP 4X18 X RAY DECT (DISPOSABLE) ×3 IMPLANT
STRIP CLOSURE SKIN 1/2X4 (GAUZE/BANDAGES/DRESSINGS) ×2 IMPLANT
STRIP CLOSURE SKIN 1/4X4 (GAUZE/BANDAGES/DRESSINGS) ×2 IMPLANT
SUT MNCRL AB 4-0 PS2 18 (SUTURE) ×3 IMPLANT
SUT PROLENE 0 CT 2 (SUTURE) ×3 IMPLANT
SUT PROLENE 1 CT 1 30 (SUTURE) IMPLANT
SUT SURG 0 T 19/GS 22 1969 62 (SUTURE) ×3 IMPLANT
SUT VIC AB 3-0 SH 27 (SUTURE)
SUT VIC AB 3-0 SH 27XBRD (SUTURE) IMPLANT
SYR BULB IRRIGATION 50ML (SYRINGE) IMPLANT
SYR CONTROL 10ML LL (SYRINGE) ×3 IMPLANT
TOWEL OR 17X26 10 PK STRL BLUE (TOWEL DISPOSABLE) ×3 IMPLANT
YANKAUER SUCT BULB TIP 10FT TU (MISCELLANEOUS) IMPLANT

## 2014-02-19 NOTE — Op Note (Signed)
Preoperative diagnosis: Umbilical hernia  Postoperative diagnosis: Same  Procedure: Umbilical hernia repair with mesh.  Surgeon: Avel Peaceodd Tyreshia Ingman M.D.  Anesthesia: General plus Marcaine local   Indication: This is a 48 year old male with a symptomatic hernia who presents for elective repair.  Technique: He was seen in the holding room and then brought to the operating room, placed supine on the operating table, and a general anesthetic was given.  The hair in the periumbilical area was clipped as was necessary.  The periumbilical area was sterilely prepped and draped.  Marcaine solution was infiltrated superficially and deep in the periumbilical area. A subumbilical transverse incision was made through the skin and subcutaneous tissue until the fascia was identified. The subcutaneous tissue was mobilized free from the fascia inferiorly and laterally. The umbilical stalk was mobilized and dissected free from the fascia exposing the underlying hernia defect. The hernia sac and tissue within it were reduced back into the abdominal cavity.  The subcutaneous tissue was freed from the fascia for 3-4 cm around the primary defect. The primary defect was then closed with interrupted 0 Surgilon sutures. The sutures were left long.  A piece of polypropylene mesh was brought into the field. The primary repair sutures were threaded up through the mesh and tied down anchoring the mesh directly over the primary repair. The periphery of the mesh was then anchored to the fascia with a running 0 Prolene suture to allow for 3-4 cm of mesh overlap. The excess mesh was trimmed and removed.  Hemostasis was adequate at this time. The umbilicus was reimplanted onto the mesh and fascia with 3-0 Vicryl suture. The subcutaneous tissue was closed over the mesh with a running 3-0 Vicryl suture. The skin was closed with a 4-0 Monocryl subcuticular stitch. Steri-Strips and sterile dressings were applied.  He tolerated the  procedure well without any apparent complications and was taken to the recovery room in satisfactory condition.

## 2014-02-19 NOTE — Discharge Instructions (Signed)
[facility] _______[facility]  UMBILICAL HERNIA REPAIR: POST OP INSTRUCTIONS  Always review your discharge instruction sheet given to you by the facility where your surgery was performed. IF YOU HAVE DISABILITY OR FAMILY LEAVE FORMS, YOU MUST BRING THEM TO THE OFFICE FOR PROCESSING.   DO NOT GIVE THEM TO YOUR DOCTOR.  1. A  prescription for pain medication may be given to you upon discharge.  Take your pain medication as prescribed, if needed.  If narcotic pain medicine is not needed, then you may take acetaminophen (Tylenol) or ibuprofen (Advil) as needed. 2. Take your usually prescribed medications unless otherwise directed. 3. If you need a refill on your pain medication, please contact your pharmacy.  They will contact our office to request authorization. Prescriptions will not be filled after 5 pm or on week-ends. 4. You should follow a light diet the first 24 hours after arrival home, such as soup and crackers, etc.  Be sure to include lots of fluids daily.  Resume your normal diet the day after surgery. 5. Most patients will experience some swelling and bruising around the umbilicus.  Ice packs and reclining will help.  Swelling and bruising can take several days to resolve.  6. It is common to experience some constipation if taking pain medication after surgery.  Increasing fluid intake and taking a stool softener (such as Colace) will usually help or prevent this problem from occurring.  A mild laxative (Milk of Magnesia or Miralax) should be taken according to package directions if there are no bowel movements after 48 hours. 7. Unless discharge instructions indicate otherwise, you may remove your bandages 72 hours after surgery, and you may shower the day after surgery.  You may have steri-strips (small skin tapes) in place directly over the incision.  These strips should be left on the skin.  If your surgeon used skin glue on the incision, you may shower in 24 hours.  The glue  will flake off over the next 2-3 weeks.  Any sutures or staples will be removed at the office during your follow-up visit. 8. ACTIVITIES:  You may resume regular (light) daily activities beginning the next day--such as daily self-care, walking, climbing stairs--gradually increasing activities as tolerated.  You may have sexual intercourse when it is comfortable.  Refrain from any heavy lifting or straining-nothing over 10 pounds for 6 weeks.  a. You may drive when you are no longer taking prescription pain medication, you can comfortably wear a seatbelt, and you can safely maneuver your car and apply brakes. b. RETURN TO WORK:  __________________________________________________________ 9. You should see your doctor in the office for a follow-up appointment approximately 2-3 weeks after your surgery.  Make sure that you call for this appointment within a day or two after you arrive home to insure a convenient appointment time. 10. OTHER INSTRUCTIONS:  _______Do not lie flat for two days.___________________________________________________________________________________________________________________________________________________________________________________  WHEN TO CALL YOUR DOCTOR: 1. Fever over 101.0 2. Inability to urinate 3. Nausea and/or vomiting 4. Extreme swelling or bruising 5. Continued bleeding from incision. 6. Increased pain, redness, or drainage from the incision  The clinic staff is available to answer your questions during regular business hours.  Please dont hesitate to call and ask to speak to one of the nurses for clinical concerns.  If you have a medical emergency, go to the nearest emergency room or call 911.  A surgeon from [facility] is always on call at the hospital   [address], [address], [place], [place]  [address] ? ° [address] [address], [place], [place]   [address] °[phone] ? [phone] ? FAX [phone] °Web site:  [email or web address] ° °

## 2014-02-19 NOTE — Transfer of Care (Signed)
Immediate Anesthesia Transfer of Care Note  Patient: Alexander Stanley  Procedure(s) Performed: Procedure(s): HERNIA REPAIR UMBILICAL ADULT WITH MESH (N/A) INSERTION OF MESH (N/A)  Patient Location: PACU  Anesthesia Type:General  Level of Consciousness: sedated  Airway & Oxygen Therapy: Patient Spontanous Breathing and Patient connected to face mask oxygen  Post-op Assessment: Report given to PACU RN and Post -op Vital signs reviewed and stable  Post vital signs: Reviewed and stable  Complications: No apparent anesthesia complications

## 2014-02-19 NOTE — Interval H&P Note (Signed)
History and Physical Interval Note:  02/19/2014 10:05 AM  Alexander MaroonAndrew Stanley  has presented today for surgery, with the diagnosis of umbilical hernia  The various methods of treatment have been discussed with the patient and family. After consideration of risks, benefits and other options for treatment, the patient has consented to  Procedure(s): HERNIA REPAIR UMBILICAL ADULT WITH MESH (N/A) INSERTION OF MESH (N/A) as a surgical intervention .  The patient's history has been reviewed, patient examined, no change in status, stable for surgery.  I have reviewed the patient's chart and labs.  Questions were answered to the patient's satisfaction.     Denetta Fei JShela Commons

## 2014-02-19 NOTE — H&P (View-Only) (Signed)
Patient ID: Alexander Stanley, male   DOB: 1965/04/02, 48 y.o.   MRN: [id number]   Alexander Stanley 01/20/2014 9:37 AM Location: [facility] Patient #: [id number] DOB: 1965/04/02 Married / Language: [place] / Race: White Male History of Present Illness Alexander Stanley(Alexander Stanley J. Alexander Cumbie MD; 01/20/2014 11:29 AM) Patient words: umb hernia.  The patient is a 48 year old male    Note:He is self-referred and comes in because of a painful umbilical hernia. He noticed a small bulge in the umbilical area about 2-1/2 years ago. Recently, it has become larger and progressively more uncomfortable. It is interfering with his lifestyle and his ability to do things he wants to do. No obstructive symptoms. He is interested in having the hernia repaired but is concerned about mesh being used given the complications he has heard about.  Other Problems Alexander Stanley(Alexander Stanley, CMA; 01/20/2014 9:37 AM) Anxiety Disorder Asthma Back Pain Gastroesophageal Reflux Disease Hemorrhoids High blood pressure Kidney Stone Umbilical Hernia Repair  Past Surgical History Alexander Stanley(Alexander Stanley, CMA; 01/20/2014 9:37 AM) Oral Surgery Tonsillectomy  Diagnostic Studies History Alexander Stanley(Alexander Stanley, CMA; 01/20/2014 9:37 AM) Colonoscopy never  Allergies Alexander Stanley(Alexander Stanley, CMA; 01/20/2014 9:39 AM) Effexor *ANTIDEPRESSANTS*  Medication History Alexander Stanley(Alexander Stanley, CMA; 01/20/2014 9:40 AM) Lisinopril (10MG  Tablet, Oral) Active. Hydrochlorothiazide (12.5MG  Capsule, Oral) Active.  Social History Alexander Stanley(Alexander Stanley, CMA; 01/20/2014 9:37 AM) Caffeine use Carbonated beverages. No alcohol use No drug use Tobacco use Never smoker.  Family History Alexander Stanley(Alexander Stanley, CMA; 01/20/2014 9:37 AM) Depression Father. Diabetes Mellitus Father, Mother. Heart Disease Father. Hypertension Father. Melanoma Father.     Review of Systems Alexander Stanley(Alexander Stanley CMA; 01/20/2014 9:37 AM) General Present- Fatigue and Weight Gain. Not  Present- Appetite Loss, Chills, Fever, Night Sweats and Weight Loss. Skin Not Present- Change in Wart/Mole, Dryness, Hives, Jaundice, New Lesions, Non-Healing Wounds, Rash and Ulcer. HEENT Present- Seasonal Allergies and Wears glasses/contact lenses. Not Present- Earache, Hearing Loss, Hoarseness, Nose Bleed, Oral Ulcers, Ringing in the Ears, Sinus Pain, Sore Throat, Visual Disturbances and Yellow Eyes. Respiratory Not Present- Bloody sputum, Chronic Cough, Difficulty Breathing, Snoring and Wheezing. Breast Not Present- Breast Mass, Breast Pain, Nipple Discharge and Skin Changes. Cardiovascular Not Present- Chest Pain, Difficulty Breathing Lying Down, Leg Cramps, Palpitations, Rapid Heart Rate, Shortness of Breath and Swelling of Extremities. Gastrointestinal Present- Abdominal Pain and Bloating. Not Present- Bloody Stool, Change in Bowel Habits, Chronic diarrhea, Constipation, Difficulty Swallowing, Excessive gas, Gets full quickly at meals, Hemorrhoids, Indigestion, Nausea, Rectal Pain and Vomiting. Male Genitourinary Not Present- Blood in Urine, Change in Urinary Stream, Frequency, Impotence, Nocturia, Painful Urination, Urgency and Urine Leakage. Musculoskeletal Not Present- Back Pain, Joint Pain, Joint Stiffness, Muscle Pain, Muscle Weakness and Swelling of Extremities. Neurological Not Present- Decreased Memory, Fainting, Headaches, Numbness, Seizures, Tingling, Tremor, Trouble walking and Weakness. Psychiatric Not Present- Anxiety, Bipolar, Change in Sleep Pattern, Depression, Fearful and Frequent crying. Endocrine Not Present- Cold Intolerance, Excessive Hunger, Hair Changes, Heat Intolerance, Hot flashes and New Diabetes. Hematology Not Present- Easy Bruising, Excessive bleeding, Gland problems, HIV and Persistent Infections.  Vitals Alexander Stanley(Alexander Stanley CMA; 01/20/2014 9:41 AM) 01/20/2014 9:41 AM Weight: 240.13 lb Height: 72in Body Surface Area: 2.35 m Body Mass Index: 32.57  kg/m Temp.: 98.69F  Pulse: 72 (Regular)  BP: 130/100 (Sitting, Left Arm, Standard)     Physical Exam Alexander Stanley(Alexander Stanley J. Alexander Dowty MD; 01/20/2014 11:32 AM)  The physical exam findings are as follows: Note:General: Overweight male in NAD. Pleasant and cooperative.  HEENT: [place]/AT, no facial masses  CV: RRR, no murmur, no JVD.  CHEST: Breath sounds equal and clear. Respirations nonlabored..  ABDOMEN: Soft, nontender, nondistended, moderate size umbilical bulge that is reducible with a palpable 1-1.5 cm fascial defect  GU: No bulges.  NEUROLOGIC: Alert and oriented, answers questions appropriately, normal gait and station.  PSYCHIATRIC: Normal mood, affect , and behavior.    Assessment & Plan Alexander Stanley(Alexander Stanley J. Alexander Paschal MD; 01/20/2014 11:34 AM)  UMBILICAL HERNIA WITHOUT OBSTRUCTION AND WITHOUT GANGRENE (553.1  K42.9) Impression: The hernia is symptomatic and lifestyle limiting and he would like to have it repaired. We had a long discussion about mesh and the significant advantages it offers with respect to decreased recurrence rate. Given his body size, I estimate his recurrence without mesh could be as high as 40% and I discussed this with him.  Plan: I recommended umbilical hernia repair with mesh as an outpatient. If he does not want to have the mesh, he understands the significant increase in recurrence rate. I have discussed the procedure, risks, and aftercare. Risks include but are not limited to bleeding, infection, wound healing problems, anesthesia, recurrence, accidental injury to intra-abdominal organs-such. We also discussed the rare complication of mesh rejection. All questions were answered.  Alexander Stanley

## 2014-02-19 NOTE — Anesthesia Postprocedure Evaluation (Signed)
  Anesthesia Post-op Note  Patient: Alexander Stanley  Procedure(s) Performed: Procedure(s) (LRB): HERNIA REPAIR UMBILICAL ADULT WITH MESH (N/A) INSERTION OF MESH (N/A)  Patient Location: PACU  Anesthesia Type: General  Level of Consciousness: awake and alert   Airway and Oxygen Therapy: Patient Spontanous Breathing  Post-op Pain: mild  Post-op Assessment: Post-op Vital signs reviewed, Patient's Cardiovascular Status Stable, Respiratory Function Stable, Patent Airway and No signs of Nausea or vomiting  Last Vitals:  Filed Vitals:   02/19/14 1227  BP: 122/77  Pulse: 78  Temp:   Resp: 16    Post-op Vital Signs: stable   Complications: No apparent anesthesia complications

## 2014-02-19 NOTE — Anesthesia Preprocedure Evaluation (Signed)
Anesthesia Evaluation  Patient identified by MRN, date of birth, ID band Patient awake    Reviewed: Allergy & Precautions, H&P , NPO status , Patient's Chart, lab work & pertinent test results  Airway Mallampati: II  TM Distance: >3 FB Neck ROM: full    Dental no notable dental hx.    Pulmonary neg pulmonary ROS,  breath sounds clear to auscultation  Pulmonary exam normal       Cardiovascular Exercise Tolerance: Good hypertension, Pt. on medications Rhythm:regular Rate:Normal     Neuro/Psych Anxiety negative neurological ROS  negative psych ROS   GI/Hepatic negative GI ROS, Neg liver ROS, GERD-  Medicated and Controlled,  Endo/Other  negative endocrine ROS  Renal/GU negative Renal ROS  negative genitourinary   Musculoskeletal   Abdominal   Peds  Hematology negative hematology ROS (+)   Anesthesia Other Findings   Reproductive/Obstetrics negative OB ROS                             Anesthesia Physical Anesthesia Plan  ASA: II  Anesthesia Plan: General   Post-op Pain Management:    Induction: Intravenous  Airway Management Planned: LMA  Additional Equipment:   Intra-op Plan:   Post-operative Plan:   Informed Consent: I have reviewed the patients History and Physical, chart, labs and discussed the procedure including the risks, benefits and alternatives for the proposed anesthesia with the patient or authorized representative who has indicated his/her understanding and acceptance.   Dental Advisory Given  Plan Discussed with: CRNA and Surgeon  Anesthesia Plan Comments:         Anesthesia Quick Evaluation

## 2014-02-22 ENCOUNTER — Encounter (HOSPITAL_COMMUNITY): Payer: Self-pay | Admitting: General Surgery

## 2015-11-24 ENCOUNTER — Encounter: Payer: Self-pay | Admitting: Gastroenterology

## 2016-01-03 ENCOUNTER — Ambulatory Visit (AMBULATORY_SURGERY_CENTER): Payer: Self-pay

## 2016-01-03 ENCOUNTER — Encounter: Payer: Self-pay | Admitting: Gastroenterology

## 2016-01-03 DIAGNOSIS — Z1211 Encounter for screening for malignant neoplasm of colon: Secondary | ICD-10-CM

## 2016-01-03 MED ORDER — SUPREP BOWEL PREP KIT 17.5-3.13-1.6 GM/177ML PO SOLN: 1.0000 | Freq: Once | ORAL | 0 refills | Status: AC

## 2016-01-03 NOTE — Progress Notes (Signed)
No allergies to eggs or soy No home oxygen No diet meds No past problems with anesthesia  Declined emmi  

## 2016-01-17 ENCOUNTER — Encounter: Payer: Medicaid Other | Admitting: Gastroenterology

## 2016-01-25 ENCOUNTER — Encounter: Payer: Self-pay | Admitting: Gastroenterology

## 2016-01-25 ENCOUNTER — Ambulatory Visit (AMBULATORY_SURGERY_CENTER): Payer: Managed Care, Other (non HMO) | Admitting: Gastroenterology

## 2016-01-25 VITALS — BP 122/86 | HR 84 | Temp 98.0°F | Resp 14 | Ht 73.0 in | Wt 219.0 lb

## 2016-01-25 DIAGNOSIS — Z1211 Encounter for screening for malignant neoplasm of colon: Secondary | ICD-10-CM | POA: Diagnosis present

## 2016-01-25 DIAGNOSIS — Z1212 Encounter for screening for malignant neoplasm of rectum: Secondary | ICD-10-CM | POA: Diagnosis not present

## 2016-01-25 MED ORDER — SODIUM CHLORIDE 0.9 % IV SOLN: 500.0000 mL | INTRAVENOUS | Status: AC

## 2016-01-25 NOTE — Progress Notes (Signed)
To recovery, report to [employer] RN, VSS

## 2016-01-25 NOTE — Patient Instructions (Signed)
YOU HAD AN ENDOSCOPIC PROCEDURE TODAY AT THE [facility]:   Refer to the procedure report that was given to you for any specific questions about what was found during the examination.  If the procedure report does not answer your questions, please call your gastroenterologist to clarify.  If you requested that your care partner not be given the details of your procedure findings, then the procedure report has been included in a sealed envelope for you to review at your convenience later.  YOU SHOULD EXPECT: Some feelings of bloating in the abdomen. Passage of more gas than usual.  Walking can help get rid of the air that was put into your GI tract during the procedure and reduce the bloating. If you had a lower endoscopy (such as a colonoscopy or flexible sigmoidoscopy) you may notice spotting of blood in your stool or on the toilet paper. If you underwent a bowel prep for your procedure, you may not have a normal bowel movement for a few days.  Please Note:  You might notice some irritation and congestion in your nose or some drainage.  This is from the oxygen used during your procedure.  There is no need for concern and it should clear up in a day or so.  SYMPTOMS TO REPORT IMMEDIATELY:   Following lower endoscopy (colonoscopy or flexible sigmoidoscopy):  Excessive amounts of blood in the stool  Significant tenderness or worsening of abdominal pains  Swelling of the abdomen that is new, acute  Fever of 100F or higher  For urgent or emergent issues, a gastroenterologist can be reached at any hour by calling [phone].   DIET:  We do recommend a small meal at first, but then you may proceed to your regular diet.  Drink plenty of fluids but you should avoid alcoholic beverages for 24 hours.  ACTIVITY:  You should plan to take it easy for the rest of today and you should NOT DRIVE or use heavy machinery until tomorrow (because of the sedation medicines used during the test).     FOLLOW UP: Our staff will call the number listed on your records the next business day following your procedure to check on you and address any questions or concerns that you may have regarding the information given to you following your procedure. If we do not reach you, we will leave a message.  However, if you are feeling well and you are not experiencing any problems, there is no need to return our call.  We will assume that you have returned to your regular daily activities without incident.  If any biopsies were taken you will be contacted by phone or by letter within the next 1-3 weeks.  Please call us at [phone] if you have not heard about the biopsies in 3 weeks.    SIGNATURES/CONFIDENTIALITY: You and/or your care partner have signed paperwork which will be entered into your electronic medical record.  These signatures attest to the fact that that the information above on your After Visit Summary has been reviewed and is understood.  Full responsibility of the confidentiality of this discharge information lies with you and/or your care-partner. 

## 2016-01-25 NOTE — Op Note (Signed)
[facility] Patient Name: Alexander Stanley Procedure Date: 01/25/2016 12:01 PM MRN: [id number] Endoscopist: Napoleon FormKavitha V. Nandigam , MD Age: 50 Referring MD:  Date of Birth: April 04, 1965 Gender: Male Account #: [id number] Procedure:                Colonoscopy Indications:              Screening for colorectal malignant neoplasm, This                            is the patient's first colonoscopy Medicines:                Monitored Anesthesia Care Procedure:                Pre-Anesthesia Assessment:                           - Prior to the procedure, a History and Physical                            was performed, and patient medications and                            allergies were reviewed. The patient's tolerance of                            previous anesthesia was also reviewed. The risks                            and benefits of the procedure and the sedation                            options and risks were discussed with the patient.                            All questions were answered, and informed consent                            was obtained. Prior Anticoagulants: The patient has                            taken no previous anticoagulant or antiplatelet                            agents. ASA Grade Assessment: II - A patient with                            mild systemic disease. After reviewing the risks                            and benefits, the patient was deemed in                            satisfactory condition to undergo the procedure.  After obtaining informed consent, the colonoscope                            was passed under direct vision. Throughout the                            procedure, the patient's blood pressure, pulse, and                            oxygen saturations were monitored continuously. The                            [id number] ([id number]) was introduced through the anus                            and advanced to  the the terminal ileum, with                            identification of the appendiceal orifice and IC                            valve. The colonoscopy was performed without                            difficulty. The patient tolerated the procedure                            well. The quality of the bowel preparation was                            excellent. The terminal ileum, ileocecal valve,                            appendiceal orifice, and rectum were photographed. Scope In: 12:04:37 PM Scope Out: 12:14:13 PM Scope Withdrawal Time: 0 hours 6 minutes 38 seconds  Total Procedure Duration: 0 hours 9 minutes 36 seconds  Findings:                 The perianal and digital rectal examinations were                            normal.                           A diffuse area of severe melanosis was found in the                            entire colon.                           The exam was otherwise without abnormality. Complications:            No immediate complications. Estimated Blood Loss:     Estimated blood loss: none. Impression:               - Melanosis in the  colon.                           - The examination was otherwise normal.                           - No specimens collected. Recommendation:           - Patient has a contact number available for                            emergencies. The signs and symptoms of potential                            delayed complications were discussed with the                            patient. Return to normal activities tomorrow.                            Written discharge instructions were provided to the                            patient.                           - Resume previous diet.                           - Continue present medications.                           -Avoid senna, can consider taking benefiber daily 1                            tablespoon with meals or Miralax 1 capful daily ,                            titrate  to have 1 soft BM daily                           - Repeat colonoscopy in 10 years for screening                            purposes. Napoleon FormKavitha V. Nandigam, MD 01/25/2016 12:20:00 PM This report has been signed electronically.

## 2016-01-30 ENCOUNTER — Telehealth: Payer: Self-pay | Admitting: *Deleted

## 2016-01-30 NOTE — Telephone Encounter (Signed)
  Follow up Call-  Call back number 01/25/2016  Post procedure Call Back phone  # [phone]  Permission to leave phone message Yes  Some recent data might be hidden     Patient questions:  Do you have a fever, pain , or abdominal swelling? No. Pain Score  0 *  Have you tolerated food without any problems? Yes.    Have you been able to return to your normal activities? Yes.    Do you have any questions about your discharge instructions: Diet   No. Medications  No. Follow up visit  No.  Do you have questions or concerns about your Care? No.  Actions: * If pain score is 4 or above: No action needed, pain <4.

## 2019-05-29 ENCOUNTER — Ambulatory Visit: Payer: Self-pay | Attending: Internal Medicine

## 2019-05-29 DIAGNOSIS — Z23 Encounter for immunization: Secondary | ICD-10-CM

## 2019-05-29 NOTE — Progress Notes (Signed)
   Covid-19 Vaccination Clinic  Name:  Alexander Stanley    MRN: [id number] DOB: [birth year]  05/29/2019  [name] was observed post Covid-19 immunization for 15 minutes without incident. He was provided with Vaccine Information Sheet and instruction to access the V-Safe system.   [name] was instructed to call 911 with any severe reactions post vaccine: Marland Kitchen Difficulty breathing  . Swelling of face and throat  . A fast heartbeat  . A bad rash all over body  . Dizziness and weakness   Immunizations Administered    Name Date Dose VIS Date Route   Pfizer COVID-19 Vaccine 05/29/2019  3:35 PM 0.3 mL 02/13/2019 Intramuscular   Manufacturer: [employer], [employer]   Lot: [id number]   NDC: [address]-7

## 2019-06-18 ENCOUNTER — Telehealth: Payer: Self-pay | Admitting: *Deleted

## 2019-06-18 NOTE — Telephone Encounter (Signed)
Pt calling to attempt to reschedule 2nd covid vaccine. Unable to do so on date requested. Resource numbers given. Pt verbalizes understanding.

## 2019-06-24 ENCOUNTER — Ambulatory Visit: Payer: Self-pay

## 2019-10-23 ENCOUNTER — Other Ambulatory Visit: Payer: Self-pay

## 2019-10-23 ENCOUNTER — Emergency Department (HOSPITAL_COMMUNITY): Payer: 59 | Admitting: Anesthesiology

## 2019-10-23 ENCOUNTER — Emergency Department (HOSPITAL_COMMUNITY): Payer: 59

## 2019-10-23 ENCOUNTER — Observation Stay (HOSPITAL_COMMUNITY)
Admission: EM | Admit: 2019-10-23 | Discharge: 2019-10-26 | Disposition: A | Discharge status: Home / Self Care | Payer: 59 | Attending: Orthopaedic Surgery | Admitting: Orthopaedic Surgery

## 2019-10-23 ENCOUNTER — Encounter (HOSPITAL_COMMUNITY): Payer: Self-pay | Admitting: *Deleted

## 2019-10-23 ENCOUNTER — Encounter (HOSPITAL_COMMUNITY)
Admission: EM | Disposition: A | Discharge status: Home / Self Care | Payer: Self-pay | Source: Home / Self Care | Attending: Orthopaedic Surgery

## 2019-10-23 DIAGNOSIS — W19XXXA Unspecified fall, initial encounter: Secondary | ICD-10-CM

## 2019-10-23 DIAGNOSIS — S82201B Unspecified fracture of shaft of right tibia, initial encounter for open fracture type I or II: Secondary | ICD-10-CM

## 2019-10-23 DIAGNOSIS — S92255A Nondisplaced fracture of navicular [scaphoid] of left foot, initial encounter for closed fracture: Secondary | ICD-10-CM

## 2019-10-23 DIAGNOSIS — Y999 Unspecified external cause status: Secondary | ICD-10-CM | POA: Diagnosis not present

## 2019-10-23 DIAGNOSIS — Y929 Unspecified place or not applicable: Secondary | ICD-10-CM | POA: Diagnosis not present

## 2019-10-23 DIAGNOSIS — Z23 Encounter for immunization: Secondary | ICD-10-CM | POA: Diagnosis not present

## 2019-10-23 DIAGNOSIS — S82401B Unspecified fracture of shaft of right fibula, initial encounter for open fracture type I or II: Secondary | ICD-10-CM

## 2019-10-23 DIAGNOSIS — W1842XA Slipping, tripping and stumbling without falling due to stepping into hole or opening, initial encounter: Secondary | ICD-10-CM | POA: Insufficient documentation

## 2019-10-23 DIAGNOSIS — Y939 Activity, unspecified: Secondary | ICD-10-CM | POA: Insufficient documentation

## 2019-10-23 DIAGNOSIS — S8991XA Unspecified injury of right lower leg, initial encounter: Secondary | ICD-10-CM | POA: Diagnosis present

## 2019-10-23 DIAGNOSIS — Z20822 Contact with and (suspected) exposure to covid-19: Secondary | ICD-10-CM | POA: Insufficient documentation

## 2019-10-23 DIAGNOSIS — I10 Essential (primary) hypertension: Secondary | ICD-10-CM | POA: Diagnosis not present

## 2019-10-23 DIAGNOSIS — Z419 Encounter for procedure for purposes other than remedying health state, unspecified: Secondary | ICD-10-CM

## 2019-10-23 HISTORY — PX: I & D EXTREMITY: SHX5045

## 2019-10-23 HISTORY — PX: EXTERNAL FIXATION LEG: SHX1549

## 2019-10-23 HISTORY — PX: TIBIA IM NAIL INSERTION: SHX2516

## 2019-10-23 LAB — CBC WITH DIFFERENTIAL/PLATELET
Abs Immature Granulocytes: 0.05 10*3/uL (ref 0.00–0.07)
Basophils Absolute: 0.1 10*3/uL (ref 0.0–0.1)
Basophils Relative: 0 %
Eosinophils Absolute: 0.2 10*3/uL (ref 0.0–0.5)
Eosinophils Relative: 2 %
HCT: 48.2 % (ref 39.0–52.0)
Hemoglobin: 15.4 g/dL (ref 13.0–17.0)
Immature Granulocytes: 0 %
Lymphocytes Relative: 22 %
Lymphs Abs: 2.5 10*3/uL (ref 0.7–4.0)
MCH: 27.8 pg (ref 26.0–34.0)
MCHC: 32 g/dL (ref 30.0–36.0)
MCV: 87 fL (ref 80.0–100.0)
Monocytes Absolute: 0.7 10*3/uL (ref 0.1–1.0)
Monocytes Relative: 6 %
Neutro Abs: 7.9 10*3/uL — ABNORMAL HIGH (ref 1.7–7.7)
Neutrophils Relative %: 70 %
Platelets: 189 10*3/uL (ref 150–400)
RBC: 5.54 MIL/uL (ref 4.22–5.81)
RDW: 12.9 % (ref 11.5–15.5)
WBC: 11.4 10*3/uL — ABNORMAL HIGH (ref 4.0–10.5)
nRBC: 0 % (ref 0.0–0.2)

## 2019-10-23 LAB — TYPE AND SCREEN
ABO/RH(D): B NEG
Antibody Screen: NEGATIVE

## 2019-10-23 LAB — COMPREHENSIVE METABOLIC PANEL
ALT: 28 U/L (ref 0–44)
AST: 25 U/L (ref 15–41)
Albumin: 4.1 g/dL (ref 3.5–5.0)
Alkaline Phosphatase: 45 U/L (ref 38–126)
Anion gap: 9 (ref 5–15)
BUN: 27 mg/dL — ABNORMAL HIGH (ref 6–20)
CO2: 24 mmol/L (ref 22–32)
Calcium: 9 mg/dL (ref 8.9–10.3)
Chloride: 103 mmol/L (ref 98–111)
Creatinine, Ser: 0.95 mg/dL (ref 0.61–1.24)
GFR calc Af Amer: >60 mL/min (ref >60)
GFR calc non Af Amer: >60 mL/min (ref >60)
Glucose, Bld: 111 mg/dL — ABNORMAL HIGH (ref 70–99)
Potassium: 3.6 mmol/L (ref 3.5–5.1)
Sodium: 136 mmol/L (ref 135–145)
Total Bilirubin: 0.6 mg/dL (ref 0.3–1.2)
Total Protein: 6.7 g/dL (ref 6.5–8.1)

## 2019-10-23 LAB — PROTIME-INR
INR: 1 (ref 0.8–1.2)
Prothrombin Time: 12.6 seconds (ref 11.4–15.2)

## 2019-10-23 LAB — SARS CORONAVIRUS 2 BY RT PCR (HOSPITAL ORDER, PERFORMED IN ~~LOC~~ HOSPITAL LAB): SARS Coronavirus 2: NEGATIVE

## 2019-10-23 SURGERY — IRRIGATION AND DEBRIDEMENT EXTREMITY
Anesthesia: General | Site: Leg Lower | Laterality: Right

## 2019-10-23 MED ORDER — ROCURONIUM BROMIDE 100 MG/10ML IV SOLN
INTRAVENOUS | Status: DC | PRN
  Administered 2019-10-23: 50 mg via INTRAVENOUS

## 2019-10-23 MED ORDER — LIDOCAINE HCL (CARDIAC) PF 100 MG/5ML IV SOSY
PREFILLED_SYRINGE | INTRAVENOUS | Status: DC | PRN
  Administered 2019-10-23: 40 mg via INTRATRACHEAL

## 2019-10-23 MED ORDER — MIDAZOLAM HCL 2 MG/2ML IJ SOLN
INTRAMUSCULAR | Status: AC
  Filled 2019-10-23: qty 2

## 2019-10-23 MED ORDER — HYDROMORPHONE HCL 1 MG/ML IJ SOLN
1.0000 mg | Freq: Once | INTRAMUSCULAR | Status: AC
  Administered 2019-10-23: 1 mg via INTRAVENOUS

## 2019-10-23 MED ORDER — HYDROMORPHONE HCL 1 MG/ML IJ SOLN
INTRAMUSCULAR | Status: AC
Start: 2019-10-23 — End: 2019-10-24
  Filled 2019-10-23: qty 1

## 2019-10-23 MED ORDER — HYDROMORPHONE HCL 1 MG/ML IJ SOLN: 1.0000 mg | INTRAMUSCULAR | Status: DC | PRN

## 2019-10-23 MED ORDER — MIDAZOLAM HCL 5 MG/5ML IJ SOLN
INTRAMUSCULAR | Status: DC | PRN
  Administered 2019-10-23: 2 mg via INTRAVENOUS

## 2019-10-23 MED ORDER — FENTANYL CITRATE (PF) 250 MCG/5ML IJ SOLN
INTRAMUSCULAR | Status: AC
  Filled 2019-10-23: qty 5

## 2019-10-23 MED ORDER — 0.9 % SODIUM CHLORIDE (POUR BTL) OPTIME
TOPICAL | Status: DC | PRN
  Administered 2019-10-23: 1000 mL

## 2019-10-23 MED ORDER — PROPOFOL 10 MG/ML IV BOLUS
INTRAVENOUS | Status: AC
  Filled 2019-10-23: qty 40

## 2019-10-23 MED ORDER — SUCCINYLCHOLINE CHLORIDE 20 MG/ML IJ SOLN
INTRAMUSCULAR | Status: DC | PRN
  Administered 2019-10-23: 140 mg via INTRAVENOUS

## 2019-10-23 MED ORDER — SODIUM CHLORIDE 0.9 % IR SOLN
Status: DC | PRN
  Administered 2019-10-23: 3000 mL

## 2019-10-23 MED ORDER — TETANUS-DIPHTH-ACELL PERTUSSIS 5-2.5-18.5 LF-MCG/0.5 IM SUSP
0.5000 mL | Freq: Once | INTRAMUSCULAR | Status: AC
  Administered 2019-10-23: 0.5 mL via INTRAMUSCULAR
  Filled 2019-10-23: qty 0.5

## 2019-10-23 MED ORDER — LACTATED RINGERS IV SOLN: INTRAVENOUS | Status: DC | PRN

## 2019-10-23 MED ORDER — PROPOFOL 10 MG/ML IV BOLUS
INTRAVENOUS | Status: DC | PRN
  Administered 2019-10-23: 160 mg via INTRAVENOUS

## 2019-10-23 MED ORDER — FENTANYL CITRATE (PF) 250 MCG/5ML IJ SOLN
INTRAMUSCULAR | Status: DC | PRN
  Administered 2019-10-23: 100 ug via INTRAVENOUS
  Administered 2019-10-23: 50 ug via INTRAVENOUS
  Administered 2019-10-23 (×2): 100 ug via INTRAVENOUS
  Administered 2019-10-23 – 2019-10-24 (×3): 50 ug via INTRAVENOUS

## 2019-10-23 MED ORDER — CEFAZOLIN SODIUM-DEXTROSE 2-3 GM-%(50ML) IV SOLR
INTRAVENOUS | Status: DC | PRN
  Administered 2019-10-23: 2 g via INTRAVENOUS

## 2019-10-23 SURGICAL SUPPLY — 87 items
BANDAGE ESMARK 6X9 LF (GAUZE/BANDAGES/DRESSINGS) IMPLANT
BAR GLASS FIBER EXFX 11X400 (EXFIX) ×12 IMPLANT
BIT DRILL 2.5X2.75 QC CALB (BIT) ×3 IMPLANT
BIT DRILL 4.3 CALIBRATED (DRILL) ×1 IMPLANT
BIT DRILL CROWE PNT TWST 4.5MM (DRILL) ×1 IMPLANT
BLADE SURG 15 STRL LF DISP TIS (BLADE) ×1 IMPLANT
BLADE SURG 15 STRL SS (BLADE) ×2
BNDG COHESIVE 4X5 TAN STRL (GAUZE/BANDAGES/DRESSINGS) ×3 IMPLANT
BNDG ELASTIC 6X10 VLCR STRL LF (GAUZE/BANDAGES/DRESSINGS) ×3 IMPLANT
BNDG ESMARK 4X9 LF (GAUZE/BANDAGES/DRESSINGS) IMPLANT
BNDG ESMARK 6X9 LF (GAUZE/BANDAGES/DRESSINGS)
BNDG GAUZE ELAST 4 BULKY (GAUZE/BANDAGES/DRESSINGS) ×3 IMPLANT
CLAMP BLUE BAR TO BAR (EXFIX) ×6 IMPLANT
CLAMP BLUE BAR TO PIN (EXFIX) ×12 IMPLANT
COVER MAYO STAND STRL (DRAPES) ×6 IMPLANT
COVER SURGICAL LIGHT HANDLE (MISCELLANEOUS) ×6 IMPLANT
COVER WAND RF STERILE (DRAPES) IMPLANT
CUFF TOURN SGL QUICK 34 (TOURNIQUET CUFF) ×2
CUFF TRNQT CYL 34X4.125X (TOURNIQUET CUFF) ×1 IMPLANT
DRAPE C-ARM 42X72 X-RAY (DRAPES) ×3 IMPLANT
DRAPE C-ARMOR (DRAPES) ×3 IMPLANT
DRAPE HALF SHEET 40X57 (DRAPES) ×9 IMPLANT
DRAPE IMP U-DRAPE 54X76 (DRAPES) ×3 IMPLANT
DRAPE INCISE IOBAN 66X45 STRL (DRAPES) IMPLANT
DRAPE ORTHO SPLIT 77X108 STRL (DRAPES) ×4
DRAPE SURG ORHT 6 SPLT 77X108 (DRAPES) ×2 IMPLANT
DRAPE U-SHAPE 47X51 STRL (DRAPES) ×3 IMPLANT
DRAPE UNIVERSAL PACK (DRAPES) IMPLANT
DRILL 4.3 CALIBRATED (DRILL) ×3
DRILL CROWE POINT TWIST 4.5MM (DRILL) ×3
DRSG PAD ABDOMINAL 8X10 ST (GAUZE/BANDAGES/DRESSINGS) IMPLANT
DRSG XEROFORM 1X8 (GAUZE/BANDAGES/DRESSINGS) IMPLANT
DURAPREP 26ML APPLICATOR (WOUND CARE) ×3 IMPLANT
ELECT REM PT RETURN 9FT ADLT (ELECTROSURGICAL) ×3
ELECTRODE REM PT RTRN 9FT ADLT (ELECTROSURGICAL) ×1 IMPLANT
FACESHIELD OPICON LG (MASK) IMPLANT
GAUZE SPONGE 4X4 12PLY STRL (GAUZE/BANDAGES/DRESSINGS) ×3 IMPLANT
GAUZE SPONGE 4X4 12PLY STRL LF (GAUZE/BANDAGES/DRESSINGS) ×3 IMPLANT
GAUZE XEROFORM 1X8 LF (GAUZE/BANDAGES/DRESSINGS) ×6 IMPLANT
GAUZE XEROFORM 5X9 LF (GAUZE/BANDAGES/DRESSINGS) ×3 IMPLANT
GLOVE BIOGEL M STRL SZ7.5 (GLOVE) ×3 IMPLANT
GLOVE BIOGEL PI IND STRL 8 (GLOVE) ×1 IMPLANT
GLOVE BIOGEL PI INDICATOR 8 (GLOVE) ×2
GLOVE INDICATOR 8.0 STRL GRN (GLOVE) ×3 IMPLANT
GOWN STRL REUS W/ TWL LRG LVL3 (GOWN DISPOSABLE) ×1 IMPLANT
GOWN STRL REUS W/ TWL XL LVL3 (GOWN DISPOSABLE) ×1 IMPLANT
GOWN STRL REUS W/TWL LRG LVL3 (GOWN DISPOSABLE) ×2
GOWN STRL REUS W/TWL XL LVL3 (GOWN DISPOSABLE) ×2
GUIDEPIN 3.0 THREADED 305MM (PIN) ×3 IMPLANT
GUIDEWIRE BALL NOSE 100CM (WIRE) ×6 IMPLANT
HANDPIECE INTERPULSE COAX TIP (DISPOSABLE)
KIT BASIN OR (CUSTOM PROCEDURE TRAY) ×3 IMPLANT
KIT TURNOVER KIT B (KITS) ×3 IMPLANT
MANIFOLD NEPTUNE II (INSTRUMENTS) ×3 IMPLANT
NAIL Z TIBIA 9.3X36 UNIV (Nail) ×3 IMPLANT
NEEDLE 22X1 1/2 (OR ONLY) (NEEDLE) ×3 IMPLANT
NS IRRIG 1000ML POUR BTL (IV SOLUTION) ×3 IMPLANT
PACK ORTHO EXTREMITY (CUSTOM PROCEDURE TRAY) ×6 IMPLANT
PACK UNIVERSAL I (CUSTOM PROCEDURE TRAY) ×3 IMPLANT
PAD ABD 8X10 STRL (GAUZE/BANDAGES/DRESSINGS) ×6 IMPLANT
PAD ARMBOARD 7.5X6 YLW CONV (MISCELLANEOUS) ×6 IMPLANT
PAD CAST 4YDX4 CTTN HI CHSV (CAST SUPPLIES) ×1 IMPLANT
PADDING CAST COTTON 4X4 STRL (CAST SUPPLIES) ×2
PADDING CAST COTTON 6X4 STRL (CAST SUPPLIES) ×3 IMPLANT
PIN TRANSFIXING 5.0 (EXFIX) ×6 IMPLANT
PLATE TUB 100DEG 5 HO (Plate) ×3 IMPLANT
SCREW CANC FEM FA 5X30 (Screw) ×6 IMPLANT
SCREW CORT HEX 5X65 (Screw) ×3 IMPLANT
SCREW CORTICAL 3.5MM  10MM (Screw) ×6 IMPLANT
SCREW CORTICAL 3.5MM 10MM (Screw) ×3 IMPLANT
SCREW HEX HEAD 3.5X32.5 (Screw) ×2 IMPLANT
SCREW HEX HEAD 3.5X42.5 (Screw) ×3 IMPLANT
SCREW Z NAIL 5.0X32.5 CORT (Screw) ×1 IMPLANT
SET HNDPC FAN SPRY TIP SCT (DISPOSABLE) IMPLANT
SPONGE LAP 18X18 RF (DISPOSABLE) ×3 IMPLANT
SPONGE LAP 18X18 X RAY DECT (DISPOSABLE) ×3 IMPLANT
STAPLER VISISTAT 35W (STAPLE) ×6 IMPLANT
STOCKINETTE IMPERVIOUS 9X36 MD (GAUZE/BANDAGES/DRESSINGS) ×3 IMPLANT
SUT ETHILON 2 0 FS 18 (SUTURE) IMPLANT
SUT MON AB 2-0 CT1 27 (SUTURE) ×6 IMPLANT
SYR BULB IRRIG 60ML STRL (SYRINGE) ×3 IMPLANT
TOWEL GREEN STERILE (TOWEL DISPOSABLE) ×3 IMPLANT
TOWEL GREEN STERILE FF (TOWEL DISPOSABLE) ×3 IMPLANT
TUBE CONNECTING 12'X1/4 (SUCTIONS) ×1
TUBE CONNECTING 12X1/4 (SUCTIONS) ×2 IMPLANT
UNDERPAD 30X36 HEAVY ABSORB (UNDERPADS AND DIAPERS) ×3 IMPLANT
YANKAUER SUCT BULB TIP NO VENT (SUCTIONS) ×3 IMPLANT

## 2019-10-23 NOTE — Consult Note (Signed)
Visited with pt and wife, who accepted prayer of support and for healing. Family is [place]. Please call again if there is further need of chaplain services.  Rev. Donnel Saxon Chaplain

## 2019-10-23 NOTE — ED Notes (Signed)
Portable xrays of ;ower leg and ankle

## 2019-10-23 NOTE — Anesthesia Preprocedure Evaluation (Addendum)
Anesthesia Evaluation  Patient identified by MRN, date of birth, ID band Patient awake    Reviewed: Allergy & Precautions, NPO status , Patient's Chart, lab work & pertinent test results  History of Anesthesia Complications Negative for: history of anesthetic complications  Airway Mallampati: II  TM Distance: >3 FB Neck ROM: Full    Dental  (+) Teeth Intact, Dental Advisory Given   Pulmonary neg pulmonary ROS,  10/23/2019 SARS coronavirus NEG   breath sounds clear to auscultation       Cardiovascular hypertension, Pt. on medications (-) angina Rhythm:Regular Rate:Normal     Neuro/Psych negative neurological ROS     GI/Hepatic Neg liver ROS, GERD  Medicated and Controlled,  Endo/Other  negative endocrine ROS  Renal/GU negative Renal ROS     Musculoskeletal   Abdominal   Peds  Hematology negative hematology ROS (+)   Anesthesia Other Findings   Reproductive/Obstetrics                           Anesthesia Physical Anesthesia Plan  ASA: II and emergent  Anesthesia Plan: General   Post-op Pain Management:    Induction: Intravenous  PONV Risk Score and Plan: 3 and Ondansetron, Dexamethasone, Midazolam and Treatment may vary due to age or medical condition  Airway Management Planned: Oral ETT  Additional Equipment: None  Intra-op Plan:   Post-operative Plan: Extubation in OR  Informed Consent: I have reviewed the patients History and Physical, chart, labs and discussed the procedure including the risks, benefits and alternatives for the proposed anesthesia with the patient or authorized representative who has indicated his/her understanding and acceptance.     Dental advisory given  Plan Discussed with: CRNA and Surgeon  Anesthesia Plan Comments:        Anesthesia Quick Evaluation

## 2019-10-23 NOTE — ED Notes (Signed)
Dr Susa Simmonds did not see the pt in the ed

## 2019-10-23 NOTE — ED Notes (Signed)
Dr Susa Simmonds was called did not see the pt in ed

## 2019-10-23 NOTE — Progress Notes (Signed)
Orthopedic Tech Progress Note Patient Details:  Alexander Stanley Feb 09, 1966 [id number] Level 2 Trauma  Patient ID: Alexander Stanley, male   DOB: [birth year], 54 y.o.   MRN: [id number]   Alexander Stanley 10/23/2019, 9:26 PM

## 2019-10-23 NOTE — Anesthesia Procedure Notes (Signed)
Procedure Name: Intubation Date/Time: 10/23/2019 11:48 PM Performed by: Claudina Lick, CRNA Pre-anesthesia Checklist: Patient identified, Emergency Drugs available, Suction available, Patient being monitored and Timeout performed Patient Re-evaluated:Patient Re-evaluated prior to induction Oxygen Delivery Method: Circle system utilized Preoxygenation: Pre-oxygenation with 100% oxygen Induction Type: IV induction, Rapid sequence and Cricoid Pressure applied Laryngoscope Size: Miller and 2 Grade View: Grade I Tube type: Oral Tube size: 7.5 mm Number of attempts: 1 Airway Equipment and Method: Stylet Placement Confirmation: ETT inserted through vocal cords under direct vision,  positive ETCO2 and breath sounds checked- equal and bilateral Secured at: 23 cm Tube secured with: Tape Dental Injury: Teeth and Oropharynx as per pre-operative assessment

## 2019-10-23 NOTE — ED Notes (Signed)
Wife at  The bedside 

## 2019-10-23 NOTE — ED Notes (Signed)
Ice packs applied to the rlextremity

## 2019-10-23 NOTE — ED Notes (Signed)
Blanket refused hes too hot

## 2019-10-23 NOTE — ED Triage Notes (Signed)
The pt arrived by gems  He was walking from a trailer forgot that the steps was not there ?? Fell onto his rt leg  He arrived with a deformity of the rt lower extremity  Open laceration of the rt lower extremity  With bleeding  Pulse present per dr Criss Alvine  Pain and bruising of the  Lt foot a and o x 4

## 2019-10-23 NOTE — H&P (Signed)
Alexander Stanley is an 54 y.o. male.   Chief Complaint: Right open tibia and fibula fracture, segmental; left foot pain HPI: Patient stepped out of a trailer unknowingly and injured his right leg.  He was brought to the emergency department due to deformity, pain and bleeding.  ER x-rays demonstrated segmental tibia fracture with associated fibula fracture.  This was an open fracture.  Orthopedics was consulted.  Patient has pain and deformity of his right leg.  He notes bleeding.  He also has some swelling of his left foot and mild pain.  Pain is sharp in quality in the right leg.  Denies numbness or tingling.    Past Medical History:  Diagnosis Date  . Hypertension     History reviewed. No pertinent surgical history.  No family history on file. Social History:  reports that he has never smoked. He has never used smokeless tobacco. He reports that he does not drink alcohol. No history on file for drug use.  Allergies:  Allergies  Allergen Reactions  . Kiwi Extract Anaphylaxis  . Oysters [Shellfish Allergy] Nausea And Vomiting  . Effexor [Venlafaxine] Palpitations    (Not in a hospital admission)   Results for orders placed or performed during the hospital encounter of 10/23/19 (from the past 48 hour(s))  Type and screen [facility]     Status: None   Collection Time: 10/23/19  8:29 PM  Result Value Ref Range   ABO/RH(D) B NEG    Antibody Screen NEG    Sample Expiration      10/26/2019,2359 Performed at [facility], [address] [place], [place] [address]   Comprehensive metabolic panel     Status: Abnormal   Collection Time: 10/23/19  8:34 PM  Result Value Ref Range   Sodium 136 135 - 145 mmol/L   Potassium 3.6 3.5 - 5.1 mmol/L   Chloride 103 98 - 111 mmol/L   CO2 24 22 - 32 mmol/L   Glucose, Bld 111 (H) 70 - 99 mg/dL    Comment: Glucose reference range applies only to samples taken after fasting for at least 8 hours.   BUN 27 (H) 6 - 20 mg/dL    Creatinine, Ser 3.82 0.61 - 1.24 mg/dL   Calcium 9.0 8.9 - 50.5 mg/dL   Total Protein 6.7 6.5 - 8.1 g/dL   Albumin 4.1 3.5 - 5.0 g/dL   AST 25 15 - 41 U/L   ALT 28 0 - 44 U/L   Alkaline Phosphatase 45 38 - 126 U/L   Total Bilirubin 0.6 0.3 - 1.2 mg/dL   GFR calc non Af Amer >60 >60 mL/min   GFR calc Af Amer >60 >60 mL/min   Anion gap 9 5 - 15    Comment: Performed at [facility], [address], [place], [place] [address]  Protime-INR     Status: None   Collection Time: 10/23/19  8:34 PM  Result Value Ref Range   Prothrombin Time 12.6 11.4 - 15.2 seconds   INR 1.0 0.8 - 1.2    Comment: (NOTE) INR goal varies based on device and disease states. Performed at [facility], [address] [place], [place] [address]   CBC with Differential     Status: Abnormal   Collection Time: 10/23/19  8:34 PM  Result Value Ref Range   WBC 11.4 (H) 4.0 - 10.5 K/uL   RBC 5.54 4.22 - 5.81 MIL/uL   Hemoglobin 15.4  13.0 - 17.0 g/dL   HCT 16.1 39 - 52 %   MCV 87.0 80.0 - 100.0 fL   MCH 27.8 26.0 - 34.0 pg   MCHC 32.0 30.0 - 36.0 g/dL   RDW 09.6 04.5 - 40.9 %   Platelets 189 150 - 400 K/uL   nRBC 0.0 0.0 - 0.2 %   Neutrophils Relative % 70 %   Neutro Abs 7.9 (H) 1.7 - 7.7 K/uL   Lymphocytes Relative 22 %   Lymphs Abs 2.5 0.7 - 4.0 K/uL   Monocytes Relative 6 %   Monocytes Absolute 0.7 0 - 1 K/uL   Eosinophils Relative 2 %   Eosinophils Absolute 0.2 0 - 0 K/uL   Basophils Relative 0 %   Basophils Absolute 0.1 0 - 0 K/uL   Immature Granulocytes 0 %   Abs Immature Granulocytes 0.05 0.00 - 0.07 K/uL    Comment: Performed at [facility], [address], [place], [place] [address]  SARS Coronavirus 2 by RT PCR (hospital order, performed in [facility] hospital lab) Nasopharyngeal Nasopharyngeal Swab     Status: None   Collection Time: 10/23/19  9:25 PM   Specimen: Nasopharyngeal Swab  Result Value Ref Range   SARS Coronavirus 2 NEGATIVE NEGATIVE    Comment:  (NOTE) SARS-CoV-2 target nucleic acids are NOT DETECTED.  The SARS-CoV-2 RNA is generally detectable in upper and lower respiratory specimens during the acute phase of infection. The lowest concentration of SARS-CoV-2 viral copies this assay can detect is 250 copies / mL. A negative result does not preclude SARS-CoV-2 infection and should not be used as the sole basis for treatment or other patient management decisions.  A negative result may occur with improper specimen collection / handling, submission of specimen other than nasopharyngeal swab, presence of viral mutation(s) within the areas targeted by this assay, and inadequate number of viral copies (<250 copies / mL). A negative result must be combined with clinical observations, patient history, and epidemiological information.  Fact Sheet for Patients:   BoilerBrush.com.cy  Fact Sheet for Healthcare Providers: https://pope.com/  This test is not yet approved or  cleared by the [place] FDA and has been authorized for detection and/or diagnosis of SARS-CoV-2 by FDA under an Emergency Use Authorization (EUA).  This EUA will remain in effect (meaning this test can be used) for the duration of the COVID-19 declaration under Section 564(b)(1) of the Act, 21 U.S.C. section 360bbb-3(b)(1), unless the authorization is terminated or revoked sooner.  Performed at [facility], [address], [place], [place] [address]    DG Tibia/Fibula Right Port  Result Date: 10/23/2019 CLINICAL DATA:  Larey Seat EXAM: PORTABLE RIGHT TIBIA AND FIBULA - 2 VIEW COMPARISON:  None. FINDINGS: Frontal and lateral views of the right tibia and fibula are obtained. There are comminuted oblique fractures through the distal right tibial and fibular diaphyses, with dorsal and lateral angulation at the fracture site. The ankle mortise appears intact. Right knee is unremarkable. IMPRESSION: 1. Comminuted  oblique fractures of the distal right tibial and fibular diaphyses. Dorsal and lateral angulation. Electronically Signed   By: Sharlet Salina M.D.   On: 10/23/2019 21:08   DG Foot Complete Left  Result Date: 10/23/2019 CLINICAL DATA:  Larey Seat EXAM: LEFT FOOT - COMPLETE 3+ VIEW COMPARISON:  None. FINDINGS: Frontal and lateral views of the left foot are obtained. Small ossific densities project off of the dorsal margin of the tarsal navicular, consistent with small avulsion  fractures. There is overlying soft tissue edema. No other acute bony abnormalities. Joint spaces are well preserved. IMPRESSION: 1. Small avulsion fractures off the dorsal margin of the tarsal navicular, with overlying soft tissue edema. Electronically Signed   By: Sharlet Salina M.D.   On: 10/23/2019 21:08    Review of Systems  Constitutional: Negative.   HENT: Negative.   Cardiovascular: Negative.   Gastrointestinal: Negative.   Musculoskeletal:       Deformity and pain and bleeding to right leg Swelling to left foot  Skin:       Laceration to right leg  Neurological: Negative.   Psychiatric/Behavioral: Negative.     Blood pressure (!) 143/111, pulse 86, temperature 97.7 F (36.5 C), resp. rate (!) 9, height 6\' 1"  (1.854 m), weight 104.3 kg, SpO2 97 %. Physical Exam HENT:     Head: Normocephalic.     Mouth/Throat:     Mouth: Mucous membranes are moist.  Eyes:     Extraocular Movements: Extraocular movements intact.  Cardiovascular:     Rate and Rhythm: Normal rate.  Pulmonary:     Effort: Pulmonary effort is normal.  Musculoskeletal:        General: Swelling, tenderness and deformity present.     Cervical back: Neck supple.     Comments: Deformity and bleeding to right leg.  The leg is twisted and in valgus position.  Toes are cool.  Endorses sensation to light touch about the foot.  Swelling to left foot.  Skin:    Comments: Small laceration to the medial aspect of the leg at the site of the deformity.   Neurological:     General: No focal deficit present.     Mental Status: He is alert.  Psychiatric:        Mood and Affect: Mood normal.      Assessment/Plan Patient has a right open segmental tibia fracture and associated fibula fracture.  Plan will be for urgent irrigation debridement and fixation of his fracture in the OR.  With regard to his left foot x-ray reveals possible avulsion off the dorsal navicular which appears chronic.  Does not need any form of immobilization for that.  He may weight-bear as tolerated on the left foot.  We discussed the risks, benefits and alternatives of surgery which include but not limited to wound healing complications, infection, nonunion, malunion, need for further surgery and damage to surrounding structures.  After weighing these risks the patient would like to proceed with surgery.  , MD 10/23/2019, 11:18 PM

## 2019-10-23 NOTE — ED Provider Notes (Addendum)
[facility] EMERGENCY DEPARTMENT Provider Note   CSN: [id number] Arrival date & time: 10/23/19  2022     History Chief Complaint  Patient presents with  . Trauma    Alexander Stanley is a 54 y.o. male.  HPI 54 year old male presents as a level 2 trauma after a fall.  He was getting out of his camper and missed a step and landed on the ground wrong on his right leg.  Has a open right tib-fib fracture.  Appears to be neurovascular intact per EMS.  He was given 2 g IV Ancef, 20 mg IV ketamine, and IV fentanyl.  He did not hit his head or suffer any other injuries though he states his left foot is hurting.  He is not sure of the exact last tetanus immunization but thinks it was around 5 years ago.  He has normal sensation and movement in his feet bilaterally.  Past Medical History:  Diagnosis Date  . Hypertension     There are no problems to display for this patient.        No family history on file.  Social History   Tobacco Use  . Smoking status: Never Smoker  . Smokeless tobacco: Never Used  Substance Use Topics  . Alcohol use: Never  . Drug use: Not on file    Home Medications Prior to Admission medications   Not on File    Allergies    Patient has no known allergies.  Review of Systems   Review of Systems  Unable to perform ROS: Acuity of condition    Physical Exam Updated Vital Signs BP (!) 155/101   Pulse 90   Temp 97.7 F (36.5 C)   Resp 12   Ht 6\' 1"  (1.854 m)   Wt 104.3 kg   SpO2 95%   BMI 30.34 kg/m   Physical Exam Vitals and nursing note reviewed.  Constitutional:      General: He is not in acute distress.    Appearance: He is well-developed.  HENT:     Head: Normocephalic and atraumatic.     Right Ear: External ear normal.     Left Ear: External ear normal.     Nose: Nose normal.  Eyes:     General:        Right eye: No discharge.        Left eye: No discharge.  Cardiovascular:     Rate and Rhythm: Normal  rate and regular rhythm.     Pulses:          Dorsalis pedis pulses are 1+ on the right side and 1+ on the left side.     Heart sounds: Normal heart sounds.  Pulmonary:     Effort: Pulmonary effort is normal.     Breath sounds: Normal breath sounds.  Abdominal:     General: There is no distension.     Palpations: Abdomen is soft.     Tenderness: There is no abdominal tenderness.  Musculoskeletal:     Cervical back: Neck supple.     Right lower leg: Deformity, laceration and tenderness present.     Right ankle: No swelling or deformity. No tenderness.     Left ankle: No swelling or deformity. No tenderness.     Right foot: No swelling, deformity or tenderness.     Left foot: Swelling and tenderness present. No deformity.     Comments: Right tib/fib with wound with oozing. Gross deformity of  right tib/fib with rotation. Normal sensation and movement in right foot. It is warm and normal color Left foot with lateral swelling and tenderness. Normal warmth, and normal sensation/movement. No ankle tenderness.  Skin:    General: Skin is warm and dry.  Neurological:     Mental Status: He is alert.  Psychiatric:        Mood and Affect: Mood is not anxious.       ED Results / Procedures / Treatments   Labs (all labs ordered are listed, but only abnormal results are displayed) Labs Reviewed  CBC WITH DIFFERENTIAL/PLATELET - Abnormal; Notable for the following components:      Result Value   WBC 11.4 (*)    Neutro Abs 7.9 (*)    All other components within normal limits  SARS CORONAVIRUS 2 BY RT PCR (HOSPITAL ORDER, PERFORMED IN  HOSPITAL LAB)  PROTIME-INR  COMPREHENSIVE METABOLIC PANEL  TYPE AND SCREEN  ABO/RH    EKG None  Radiology DG Tibia/Fibula Right Port  Result Date: 10/23/2019 CLINICAL DATA:  Larey Seat EXAM: PORTABLE RIGHT TIBIA AND FIBULA - 2 VIEW COMPARISON:  None. FINDINGS: Frontal and lateral views of the right tibia and fibula are obtained. There are  comminuted oblique fractures through the distal right tibial and fibular diaphyses, with dorsal and lateral angulation at the fracture site. The ankle mortise appears intact. Right knee is unremarkable. IMPRESSION: 1. Comminuted oblique fractures of the distal right tibial and fibular diaphyses. Dorsal and lateral angulation. Electronically Signed   By: Sharlet Salina M.D.   On: 10/23/2019 21:08   DG Foot Complete Left  Result Date: 10/23/2019 CLINICAL DATA:  Larey Seat EXAM: LEFT FOOT - COMPLETE 3+ VIEW COMPARISON:  None. FINDINGS: Frontal and lateral views of the left foot are obtained. Small ossific densities project off of the dorsal margin of the tarsal navicular, consistent with small avulsion fractures. There is overlying soft tissue edema. No other acute bony abnormalities. Joint spaces are well preserved. IMPRESSION: 1. Small avulsion fractures off the dorsal margin of the tarsal navicular, with overlying soft tissue edema. Electronically Signed   By: Sharlet Salina M.D.   On: 10/23/2019 21:08    Procedures .Critical Care Performed by: Pricilla Loveless, MD Authorized by: Pricilla Loveless, MD   Critical care provider statement:    Critical care time (minutes):  30   Critical care time was exclusive of:  Separately billable procedures and treating other patients   Critical care was necessary to treat or prevent imminent or life-threatening deterioration of the following conditions:  Trauma   Critical care was time spent personally by me on the following activities:  Discussions with consultants, evaluation of patient's response to treatment, examination of patient, ordering and performing treatments and interventions, ordering and review of laboratory studies, ordering and review of radiographic studies, pulse oximetry, re-evaluation of patient's condition, obtaining history from patient or surrogate and review of old charts   (including critical care time)  Medications Ordered in ED Medications    Tdap (BOOSTRIX) injection 0.5 mL (has no administration in time range)  HYDROmorphone (DILAUDID) 1 MG/ML injection (has no administration in time range)  HYDROmorphone (DILAUDID) injection 1 mg (has no administration in time range)  HYDROmorphone (DILAUDID) injection 1 mg (1 mg Intravenous Given 10/23/19 2033)    ED Course  I have reviewed the triage vital signs and the nursing notes.  Pertinent labs & imaging results that were available during my care of the patient were reviewed by me and  considered in my medical decision making (see chart for details).    MDM Rules/Calculators/A&P                          Patient with deformity to his right leg.  He is neurovascularly intact.  Has warm/well-perfused foot.  Pain seems to be decently controlled, will set up as needed Dilaudid.  Ancef given by EMS and Tdap given here.  Discussed with Dr. Susa Simmonds, who will take to the OR.  No other evident trauma besides the avulsion fracture of the navicular bone. Remains NV intact on multiple evaluations. Final Clinical Impression(s) / ED Diagnoses Final diagnoses:  Fall, initial encounter  Type I or II open fracture of right tibia and fibula, initial encounter  Closed nondisplaced fracture of navicular bone of left foot, initial encounter    Rx / DC Orders ED Discharge Orders    None       Pricilla Loveless, MD 10/23/19 1610    Pricilla Loveless, MD 10/23/19 2236

## 2019-10-23 NOTE — ED Notes (Signed)
To or 

## 2019-10-23 NOTE — ED Notes (Signed)
Ems gave fentanyl 100 mcg and ketamine 20 mg on the way here  They also gave ancef  Unsure if it was 1 gm or 2 gms

## 2019-10-24 ENCOUNTER — Inpatient Hospital Stay (HOSPITAL_COMMUNITY): Payer: 59

## 2019-10-24 ENCOUNTER — Encounter (HOSPITAL_COMMUNITY): Payer: Self-pay | Admitting: Anesthesiology

## 2019-10-24 DIAGNOSIS — S82201B Unspecified fracture of shaft of right tibia, initial encounter for open fracture type I or II: Secondary | ICD-10-CM | POA: Diagnosis not present

## 2019-10-24 DIAGNOSIS — S92255A Nondisplaced fracture of navicular [scaphoid] of left foot, initial encounter for closed fracture: Secondary | ICD-10-CM | POA: Diagnosis not present

## 2019-10-24 DIAGNOSIS — I10 Essential (primary) hypertension: Secondary | ICD-10-CM | POA: Diagnosis not present

## 2019-10-24 DIAGNOSIS — Z20822 Contact with and (suspected) exposure to covid-19: Secondary | ICD-10-CM | POA: Diagnosis not present

## 2019-10-24 LAB — ABO/RH: ABO/RH(D): B NEG

## 2019-10-24 MED ORDER — PROMETHAZINE HCL 25 MG/ML IJ SOLN
INTRAMUSCULAR | Status: AC
  Filled 2019-10-24: qty 1

## 2019-10-24 MED ORDER — OXYCODONE HCL 5 MG PO TABS
5.0000 mg | ORAL_TABLET | ORAL | Status: DC | PRN
  Administered 2019-10-24: 10 mg via ORAL
  Administered 2019-10-25: 5 mg via ORAL
  Administered 2019-10-25: 10 mg via ORAL
  Filled 2019-10-24 (×2): qty 2
  Filled 2019-10-24: qty 1
  Filled 2019-10-24 (×2): qty 2

## 2019-10-24 MED ORDER — HYDROMORPHONE HCL 1 MG/ML IJ SOLN
0.2500 mg | INTRAMUSCULAR | Status: DC | PRN
  Administered 2019-10-24 (×2): 0.5 mg via INTRAVENOUS

## 2019-10-24 MED ORDER — MEPERIDINE HCL 25 MG/ML IJ SOLN: 6.2500 mg | INTRAMUSCULAR | Status: DC | PRN

## 2019-10-24 MED ORDER — CEFAZOLIN SODIUM 1 G IJ SOLR
INTRAMUSCULAR | Status: AC
  Filled 2019-10-24: qty 20

## 2019-10-24 MED ORDER — METHOCARBAMOL 500 MG PO TABS
500.0000 mg | ORAL_TABLET | Freq: Four times a day (QID) | ORAL | Status: DC | PRN
  Administered 2019-10-25 (×2): 500 mg via ORAL
  Filled 2019-10-24 (×2): qty 1

## 2019-10-24 MED ORDER — HYDROMORPHONE HCL 1 MG/ML IJ SOLN
0.5000 mg | INTRAMUSCULAR | Status: DC | PRN
  Administered 2019-10-25: 1 mg via INTRAVENOUS
  Filled 2019-10-24: qty 1

## 2019-10-24 MED ORDER — PHENYLEPHRINE HCL (PRESSORS) 10 MG/ML IV SOLN
INTRAVENOUS | Status: DC | PRN
  Administered 2019-10-24: 80 ug via INTRAVENOUS

## 2019-10-24 MED ORDER — DIPHENHYDRAMINE HCL 50 MG/ML IJ SOLN
INTRAMUSCULAR | Status: DC | PRN
  Administered 2019-10-24: 12.5 mg via INTRAVENOUS

## 2019-10-24 MED ORDER — METHOCARBAMOL 1000 MG/10ML IJ SOLN
500.0000 mg | Freq: Four times a day (QID) | INTRAVENOUS | Status: DC | PRN
  Filled 2019-10-24: qty 5

## 2019-10-24 MED ORDER — SUCCINYLCHOLINE CHLORIDE 200 MG/10ML IV SOSY
PREFILLED_SYRINGE | INTRAVENOUS | Status: AC
  Filled 2019-10-24: qty 10

## 2019-10-24 MED ORDER — ONDANSETRON HCL 4 MG/2ML IJ SOLN
INTRAMUSCULAR | Status: AC
  Filled 2019-10-24: qty 2

## 2019-10-24 MED ORDER — ENOXAPARIN SODIUM 40 MG/0.4ML ~~LOC~~ SOLN
40.0000 mg | SUBCUTANEOUS | Status: DC
  Administered 2019-10-25 – 2019-10-26 (×2): 40 mg via SUBCUTANEOUS
  Filled 2019-10-24 (×3): qty 0.4

## 2019-10-24 MED ORDER — VANCOMYCIN HCL 500 MG IV SOLR
INTRAVENOUS | Status: AC
  Filled 2019-10-24: qty 500

## 2019-10-24 MED ORDER — PROMETHAZINE HCL 25 MG/ML IJ SOLN
6.2500 mg | INTRAMUSCULAR | Status: DC | PRN
  Administered 2019-10-24: 6.25 mg via INTRAVENOUS

## 2019-10-24 MED ORDER — NAPROXEN 250 MG PO TABS
250.0000 mg | ORAL_TABLET | Freq: Two times a day (BID) | ORAL | Status: DC
  Administered 2019-10-24 – 2019-10-26 (×6): 250 mg via ORAL
  Filled 2019-10-24 (×7): qty 1

## 2019-10-24 MED ORDER — MIDAZOLAM HCL 2 MG/2ML IJ SOLN: 0.5000 mg | Freq: Once | INTRAMUSCULAR | Status: DC | PRN

## 2019-10-24 MED ORDER — HYDROMORPHONE HCL 1 MG/ML IJ SOLN
INTRAMUSCULAR | Status: AC
  Filled 2019-10-24: qty 1

## 2019-10-24 MED ORDER — ONDANSETRON HCL 4 MG PO TABS: 4.0000 mg | ORAL_TABLET | Freq: Four times a day (QID) | ORAL | Status: DC | PRN

## 2019-10-24 MED ORDER — ONDANSETRON HCL 4 MG/2ML IJ SOLN: 4.0000 mg | Freq: Four times a day (QID) | INTRAMUSCULAR | Status: DC | PRN

## 2019-10-24 MED ORDER — DOCUSATE SODIUM 100 MG PO CAPS
100.0000 mg | ORAL_CAPSULE | Freq: Two times a day (BID) | ORAL | Status: DC
  Administered 2019-10-24 – 2019-10-26 (×4): 100 mg via ORAL
  Filled 2019-10-24 (×4): qty 1

## 2019-10-24 MED ORDER — EPHEDRINE SULFATE 50 MG/ML IJ SOLN
INTRAMUSCULAR | Status: DC | PRN
  Administered 2019-10-24 (×2): 5 mg via INTRAVENOUS

## 2019-10-24 MED ORDER — ACETAMINOPHEN 325 MG PO TABS
325.0000 mg | ORAL_TABLET | Freq: Four times a day (QID) | ORAL | Status: DC | PRN
  Administered 2019-10-26: 650 mg via ORAL
  Filled 2019-10-24: qty 2

## 2019-10-24 MED ORDER — CEFAZOLIN SODIUM-DEXTROSE 1-4 GM/50ML-% IV SOLN
1.0000 g | Freq: Four times a day (QID) | INTRAVENOUS | Status: AC
  Administered 2019-10-24 (×2): 1 g via INTRAVENOUS
  Filled 2019-10-24 (×3): qty 50

## 2019-10-24 MED ORDER — DIPHENHYDRAMINE HCL 50 MG/ML IJ SOLN
INTRAMUSCULAR | Status: AC
  Filled 2019-10-24: qty 1

## 2019-10-24 MED ORDER — ARTIFICIAL TEARS OPHTHALMIC OINT
TOPICAL_OINTMENT | OPHTHALMIC | Status: AC
  Filled 2019-10-24: qty 3.5

## 2019-10-24 MED ORDER — LIDOCAINE 2% (20 MG/ML) 5 ML SYRINGE
INTRAMUSCULAR | Status: AC
  Filled 2019-10-24: qty 5

## 2019-10-24 MED ORDER — ROCURONIUM BROMIDE 10 MG/ML (PF) SYRINGE
PREFILLED_SYRINGE | INTRAVENOUS | Status: AC
  Filled 2019-10-24: qty 10

## 2019-10-24 MED ORDER — METOCLOPRAMIDE HCL 5 MG/ML IJ SOLN: 5.0000 mg | Freq: Three times a day (TID) | INTRAMUSCULAR | Status: DC | PRN

## 2019-10-24 MED ORDER — OXYCODONE HCL 5 MG PO TABS
10.0000 mg | ORAL_TABLET | ORAL | Status: DC | PRN
  Administered 2019-10-24 – 2019-10-25 (×2): 10 mg via ORAL

## 2019-10-24 MED ORDER — GABAPENTIN 300 MG PO CAPS
300.0000 mg | ORAL_CAPSULE | Freq: Three times a day (TID) | ORAL | Status: DC
  Administered 2019-10-24 – 2019-10-26 (×8): 300 mg via ORAL
  Filled 2019-10-24 (×7): qty 1

## 2019-10-24 MED ORDER — DIPHENHYDRAMINE HCL 12.5 MG/5ML PO ELIX: 12.5000 mg | ORAL_SOLUTION | ORAL | Status: DC | PRN

## 2019-10-24 MED ORDER — ACETAMINOPHEN 10 MG/ML IV SOLN
INTRAVENOUS | Status: DC | PRN
  Administered 2019-10-24: 1000 mg via INTRAVENOUS

## 2019-10-24 MED ORDER — EPHEDRINE 5 MG/ML INJ
INTRAVENOUS | Status: AC
  Filled 2019-10-24: qty 10

## 2019-10-24 MED ORDER — ACETAMINOPHEN 10 MG/ML IV SOLN
INTRAVENOUS | Status: AC
  Filled 2019-10-24: qty 100

## 2019-10-24 MED ORDER — VANCOMYCIN HCL 500 MG IV SOLR
INTRAVENOUS | Status: DC | PRN
  Administered 2019-10-24: 500 mg

## 2019-10-24 MED ORDER — METOCLOPRAMIDE HCL 5 MG PO TABS: 5.0000 mg | ORAL_TABLET | Freq: Three times a day (TID) | ORAL | Status: DC | PRN

## 2019-10-24 MED ORDER — ALBUMIN HUMAN 5 % IV SOLN: INTRAVENOUS | Status: DC | PRN

## 2019-10-24 MED ORDER — ONDANSETRON HCL 4 MG/2ML IJ SOLN
INTRAMUSCULAR | Status: DC | PRN
  Administered 2019-10-24: 4 mg via INTRAVENOUS

## 2019-10-24 MED ORDER — PHENYLEPHRINE 40 MCG/ML (10ML) SYRINGE FOR IV PUSH (FOR BLOOD PRESSURE SUPPORT)
PREFILLED_SYRINGE | INTRAVENOUS | Status: AC
  Filled 2019-10-24: qty 10

## 2019-10-24 MED ORDER — ACETAMINOPHEN 500 MG PO TABS
1000.0000 mg | ORAL_TABLET | Freq: Four times a day (QID) | ORAL | Status: AC
  Administered 2019-10-24 – 2019-10-25 (×4): 1000 mg via ORAL
  Filled 2019-10-24 (×4): qty 2

## 2019-10-24 MED ORDER — SUGAMMADEX SODIUM 200 MG/2ML IV SOLN
INTRAVENOUS | Status: DC | PRN
  Administered 2019-10-24: 200 mg via INTRAVENOUS

## 2019-10-24 NOTE — Op Note (Signed)
QUAME SPRATLIN male 54 y.o. 10/24/2019  PreOperative Diagnosis: Right open tibia and fibula fracture, grade 1  PostOperative Diagnosis: Right open tibia and fibula fracture, grade 1 Right posterior malleolar fracture  PROCEDURE: Placement of multiplanar external fixator Irrigation and debridement of site of open fracture Open reduction internal fixation of right tibia fracture using intramedullary nail Closed treatment of right posterior malleolar fracture Intraoperative use of fluoroscopy  SURGEON: Dub Mikes, MD  ASSISTANT: None  ANESTHESIA: General  FINDINGS: Segmental comminuted open tibia fracture with comminuted fibula fracture Minimally displaced posterior malleolar fracture  IMPLANTS: [employer] tibial nail 9 mm x 360 mm  INDICATIONS:54 y.o. male sustained an open tibia and fibula fracture while stepping out of a trailer.  He was brought to the emergency department given the open wound and deformity of his leg.  X-rays revealed a comminuted tibia and fibula fractures.  Patient was given antibiotics in the emergency department.  No splint was placed in the emergency department.  He was left in a position of comfort which was quite deformed and rotated through the leg.  Toes were cold.  ER physician claimed palpable pulse was present.  Given the nature of his fracture and deformity he was indicated for irrigation debridement and open treatment of his fracture.   Patient understood the risks, benefits and alternatives to surgery which include but are not limited to wound healing complications, infection, nonunion, malunion, need for further surgery as well as damage to surrounding structures. They also understood the potential for continued pain in that there were no guarantees of acceptable outcome After weighing these risks the patient opted to proceed with surgery.  PROCEDURE: Patient was identified in the preoperative holding area.  The right was marked by  myself.  Consent was signed by myself and the patient.  Block was performed by anesthesia in the preoperative holding area.  General anesthesia was induced without difficulty.  After the patient was asleep gentle manipulation of the leg and a more straight position was performed.  I then attempted to palpate pulses.  I was able to palpate a posterior tibialis pulse.  Patient was placed supine on the operative table.  Bump was placed under the operative hip and bone foam was used.  All bony prominences were well padded. Preoperative antibiotics were given. The extremity was prepped and draped in the usual sterile fashion and surgical timeout was performed.    Placement of multiplanar external fixator: Given the gross instability at the fracture site significant amount of rotation a multiplanar external fixator was placed.  Using fluoroscopy the distal aspect of the femur was confirmed in the appropriate position confirmed.  Small stab incision was made on the lateral aspect of the distal thigh.  Then a exfix transfixion pin was placed through the distal femur and out the medial side.  A small stab incision was to allow the pin to pierce the medial skin.  This was placed center center within the distal femur.  Then we turned our attention to the calcaneus.  Through a stab incision a transfixion pin was placed through the calcaneus.  Then a multiplanar external fixator was placed about the fracture site.  Using manual traction in the axial plane, sagittal plane and coronal plane the fracture was better aligned and pulled out to length.  The external fixator pins were tightened.  Fluoroscopy confirmed acceptable position and length.  Irrigation debridement of site of open fracture: We then turned our attention the fracture site.  There was  a 2.5 cm laceration overlying the fracture site in the medial border of the tibia.  Using a 10 blade the wound was extended distally and proximally.  Then the fracture site was  identified.  There was comminution.  There was a large intercalary segment and 2 main bony fragments.  There was decent fracture read at this level however there was continued rotation and difficulty with reduction assessment.  The fracture fragments were distracted and irrigated copiously with normal saline.  Using a rondure and a curette the bony ends were cleaned.  There was no gross contamination.  Hematoma was removed as was devitalized tissue that was excised and included skin, subcutaneous tissue, fascial tissue and bone.  Approximately 3000 cc of saline was irrigated through the fracture site.  Intramedullary nail of tibia fracture:  We then turned our attention to the fracture reduction of the tibia.  Using the external fixator the fracture fragments were pulled out to the appropriate length.  Unfortunately given the significant amount of comminution at the fracture site and the large intercalary segment and rotational deformity through the fracture the fracture fragments were reduced under direct visualization and held provisionally with a lobster claw and pointed reduction forceps.  There was difficulty in maintaining the fracture reduction with all 3 pieces using clamps and therefore a small one third tubular plate was placed across the main fracture fragments with unicortical screw fixation.  Then clamps were then placed to clamp the intercalary piece into an acceptable position.  Fluoroscopy confirmed adequate fracture reduction, length and alignment of the fractures.  The fibular fracture was out to length and appropriately aligned.  We then made an incision on the lateral aspect of the patellar tendon.  This was taken sharply down through skin and subcutaneous tissue.  Then the peritenon tissue was identified and incised in line with the patellar tendon.  Then the patella tendon was further mobilized and the proximal aspect of the tibia was identified extracapsular Nedra Hai.  Then the awl was placed  within this wound and the starting point on the tibia was appropriately identified.  The patient had some bow to his tibia and therefore the starting point ended up being more lateral than usual to accommodate for this.  The entry point was confirmed on fluoroscopy and the awl was inserted.  Then a guidepin was inserted through the awl and the opening reamer was used.  Then the bead tip guidewire was placed down the medullary canal across the fracture site in place center center at the distal tibia.  This was again confirmed on fluoroscopy.  Then we measured the nail length and it was found to be 360 mm.  Then the tibial canal was reamed sequentially up to a 10.5 mm reamer and a 9 mm nail was placed without difficulty.  There was good maintenance of reduction at the fracture site with nail placement.  2 proximal interlocking screws were placed through the guide.  3 distal interlocking screws were placed using perfect circle technique.  Closed treatment of posterior malleolar fracture: During fluoroscopic evaluation and maintenance of reduction it was noted that there was a small nondisplaced fracture of the posterior malleolus.  It was acceptably reduced and we decided to treat this in a closed fashion.  Once the nail was adequately placed final fluoroscopic images were obtained.  The wounds were then irrigated copiously with normal saline.  The external fixator pins were removed from the calcaneus and the distal femur.  Complex closure of 2.5  cm traumatic wound: We then turned our attention to the traumatic wound.  This wound was inspected and found to be acceptable.  500 cc of vancomycin powder was placed into the traumatic wound and open fracture site.  Then the traumatic wound was closed in a complex layered fashion using 2-0 PDS suture and staples.  The remaining surgical wounds were closed with 2-0 PDS and staples.  Xeroform, 4 x 4's, sterile sheet cotton and Ace wrap was placed about the  extremity.  All counts were correct at the end the case.  There are no complications.  Patient tolerated procedure well.  He was taken recovery in stable condition.  All fluoroscopic images taken during the case were interpreted by me.   POST OPERATIVE INSTRUCTIONS: Touchdown weightbearing to right lower extremity in a cam walker boot. Cam walker boot has been ordered Patient will be admitted to the hospital overnight for observation and possibly discharge home tomorrow after working with physical therapy. Pain control as needed Lovenox for DVT prophylaxis and for 2 weeks after discharge Antibiotics ordered perioperatively.  TOURNIQUET TIME: No tourniquet was used  BLOOD LOSS:  250 ml         DRAINS: none         SPECIMEN: none       COMPLICATIONS:  * No complications entered in OR log *         Disposition: PACU - hemodynamically stable.         Condition: stable

## 2019-10-24 NOTE — Anesthesia Postprocedure Evaluation (Signed)
Anesthesia Post Note  Patient: Alexander Stanley  Procedure(s) Performed: IRRIGATION AND DEBRIDEMENT, TIBIA (Right Leg Lower) INTRAMEDULLARY (IM) NAIL TIBIAL (Right Leg Lower) APPLICATION AND REMOVAL OF EXTERNAL FIXATION RIGHT  LOWER LEG (Right Leg Lower)     Patient location during evaluation: PACU Anesthesia Type: General Level of consciousness: sedated, patient cooperative and oriented Pain management: pain level controlled Vital Signs Assessment: post-procedure vital signs reviewed and stable Respiratory status: spontaneous breathing, nonlabored ventilation and respiratory function stable Cardiovascular status: blood pressure returned to baseline and stable Postop Assessment: no apparent nausea or vomiting Anesthetic complications: no   No complications documented.  Last Vitals:  Vitals:   10/24/19 0230 10/24/19 0245  BP: (!) 135/97   Pulse: 93 87  Resp: 16 15  Temp: (!) 35.9 C   SpO2: 94% 91%    Last Pain:  Vitals:   10/24/19 0230  PainSc: Asleep                 Julianah Marciel,E. Tyresse Jayson

## 2019-10-24 NOTE — Evaluation (Signed)
Physical Therapy Evaluation Patient Details Name: Alexander Stanley MRN: [id number] DOB: [birth year] Today's Date: 10/24/2019   History of Present Illness  The pt is a 54 yo male presenting s/p IM nail of R open tibia and fibula fx. No significant PMH.  Clinical Impression  Pt in bed upon arrival of PT, agreeable to evaluation at this time. Prior to admission the pt was completely independent, self-employed as a Agricultural consultant. The pt now presents with limitations in functional mobility, strength, stability, and activity tolerance due to above dx and resulting pain, and will continue to benefit from skilled PT to address these deficits. The pt was able to complete bed mobility and initial OOB transfers with minA to mobilize and manage the RLE, but was limited in ambulation to 3 lateral steps and 2 forward hops with use of RW and minA to manage his RLE. The pt was unable to complete any further ambulation or stair training at this time due to significant pain and exhaustion with initial steps. Therefore, alternate d/c plans were discussed (ST rehab vs returning home to friend's house where there is a ramp and bed/bathroom on the first floor). The pt would currently be unable to navigate the 3 steps to enter his home or the flight of stairs to his bedroom and bathroom, and will continue to benefit from skilled PT to further progress activity tolerance and ambulation prior to d/c.      Follow Up Recommendations SNF;Supervision/Assistance - 24 hour (ST rehab vs home with HHPT depending on progression)    Equipment Recommendations  Other (comment) (WC, but pt may have one that fits him at home.)    Recommendations for Other Services OT consult     Precautions / Restrictions Precautions Precautions: Fall Required Braces or Orthoses: Other Brace Other Brace: tall cam boot Restrictions Weight Bearing Restrictions: Yes RLE Weight Bearing: Touchdown weight bearing      Mobility  Bed Mobility Overal  bed mobility: Needs Assistance Bed Mobility: Supine to Sit;Sit to Supine     Supine to sit: Min assist Sit to supine: Min guard   General bed mobility comments: minA to move RLE to EOB and slowly to floor, minA to complete elevation of trunk to sitting. Pt able to return BLE to bed with increased time, effort, and pain  Transfers Overall transfer level: Needs assistance Equipment used: Rolling walker (2 wheeled) Transfers: Sit to/from Stand Sit to Stand: Min assist;From elevated surface         General transfer comment: minA to maintain elevation of RLE to reduce risk of breaking precautions as well as minA to complete rise to stand. The pt was then able to maintain balance with BUE support and miNG  Ambulation/Gait Ambulation/Gait assistance: Min assist Gait Distance (Feet): 2 Feet Assistive device: Rolling walker (2 wheeled) Gait Pattern/deviations: Step-to pattern Gait velocity: decreased Gait velocity interpretation: <1.31 ft/sec, indicative of household ambulator General Gait Details: The pt was able to take a few small steps laterally to the R and then 2 step/hops forward with his LLE while maintaining TDWB with his RLE, but was limited due to pain and fatigue. He requires minA to steady and minA to mobilize his RLE due to pain and weakness preventing him from moving the RLE to allow for continued steps without assist  Stairs Stairs:  (the pt is unable, has 3 steps to enter and a flight of stairs to reach his bed/bathroom)          Wheelchair Mobility  Modified Rankin (Stroke Patients Only)       Balance Overall balance assessment: Needs assistance Sitting-balance support: Single extremity supported;Feet supported Sitting balance-Leahy Scale: Fair     Standing balance support: Bilateral upper extremity supported;During functional activity Standing balance-Leahy Scale: Poor Standing balance comment: Pt unable to maintain SLS without BUE support at this time  statically or dynamically                             Pertinent Vitals/Pain Pain Assessment: 0-10 Pain Score: 7  Pain Location: knee most, but RLE Pain Descriptors / Indicators: Grimacing;Sore;Heaviness Pain Intervention(s): Limited activity within patient's tolerance;Repositioned    Home Living Family/patient expects to be discharged to:: Private residence Living Arrangements: Spouse/significant other;Children;Other relatives Available Help at Discharge: Family;Available 24 hours/day Type of Home: House Home Access: Stairs to enter Entrance Stairs-Rails: None Entrance Stairs-Number of Steps: 3 steps to enter without rails Home Layout: Multi-level Home Equipment: Walker - 2 wheels;Wheelchair - [employer] - 4 wheels;Cane - single point      Prior Function Level of Independence: Independent         Comments: completely independent, self employed as Agricultural consultant.     Hand Dominance   Dominant Hand: Right    Extremity/Trunk Assessment   Upper Extremity Assessment Upper Extremity Assessment: Overall WFL for tasks assessed    Lower Extremity Assessment Lower Extremity Assessment: RLE deficits/detail RLE Deficits / Details: in cam boot, limited ROM against gravity knee ext and hip flex. TDWB RLE: Unable to fully assess due to pain    Cervical / Trunk Assessment Cervical / Trunk Assessment: Normal  Communication   Communication: No difficulties  Cognition Arousal/Alertness: Awake/alert Behavior During Therapy: WFL for tasks assessed/performed;Anxious Overall Cognitive Status: Within Functional Limits for tasks assessed                                 General Comments: Pt generally anxious about mobility, pain, and recovery but agreeable through session. motivated      General Comments General comments (skin integrity, edema, etc.): HR 90-110 through session. The pt was limited mostly by pain, some anxiety, and significant exhaustion  with limited mobility. We discussed rehab options and potential of him staying with a friend who has a ramp and bed/bath on the first floor    Exercises     Assessment/Plan    PT Assessment Patient needs continued PT services  PT Problem List Decreased strength;Decreased mobility;Decreased activity tolerance;Decreased balance;Pain       PT Treatment Interventions DME instruction;Gait training;Stair training;Functional mobility training;Therapeutic activities;Patient/family education;Balance training;Therapeutic exercise    PT Goals (Current goals can be found in the Care Plan section)  Acute Rehab PT Goals Patient Stated Goal: return to life as normal PT Goal Formulation: With patient/family Time For Goal Achievement: 11/07/19 Potential to Achieve Goals: Good    Frequency Min 4X/week   Barriers to discharge Inaccessible home environment pt with 3 steps to enter and a flight of stairs to reach his bed/bathroom. May have a friend's house he can stay at with a ramp and 1st floor bed/bathroom.       AM-PAC PT "6 Clicks" Mobility  Outcome Measure Help needed turning from your back to your side while in a flat bed without using bedrails?: A Little Help needed moving from lying on your back to sitting on the side of a flat bed without  using bedrails?: A Little Help needed moving to and from a bed to a chair (including a wheelchair)?: A Little Help needed standing up from a chair using your arms (e.g., wheelchair or bedside chair)?: A Little Help needed to walk in hospital room?: A Lot Help needed climbing 3-5 steps with a railing? : Total 6 Click Score: 15    End of Session Equipment Utilized During Treatment: Gait belt;Other (comment) (cam boot RLE) Activity Tolerance: Patient limited by fatigue;Patient limited by pain Patient left: in bed;with family/visitor present;with nursing/sitter in room;with call bell/phone within reach Nurse Communication: Mobility status;Weight bearing  status PT Visit Diagnosis: Unsteadiness on feet (R26.81);Difficulty in walking, not elsewhere classified (R26.2);Pain Pain - Right/Left: Right Pain - part of body: Knee;Leg    Time: 1204-1259 PT Time Calculation (min) (ACUTE ONLY): 55 min   Charges:   PT Evaluation $PT Eval Moderate Complexity: 1 Mod PT Treatments $Gait Training: 23-37 mins $Self Care/Home Management: 8-22        Rolm Baptise, PT, DPT   Acute Rehabilitation Department Pager #: [phone]  Gaetana Michaelis 10/24/2019, 2:07 PM

## 2019-10-24 NOTE — Progress Notes (Signed)
     Alexander Stanley is a 54 y.o. male   Orthopaedic diagnosis: Right open tibia and fibula fracture Posterior malleolar fracture Left foot dorsal navicular avulsion  Subjective: Patient is resting comfortably.  He has not yet worked with physical therapy.  Denies numbness or tingling.  No shortness of breath.  Objectyive: Vitals:   10/24/19 0404 10/24/19 1203  BP: 135/87 (!) 133/100  Pulse: 85 95  Resp: 17 16  Temp: 97.6 F (36.4 C) 98.8 F (37.1 C)  SpO2: 97% 93%     Exam: Awake and alert Respirations even and unlabored No acute distress  Right leg in a tall walking boot with dressing in place.  Exposed toes are warm and well-perfused.  He is able to wiggle toes.  No tenderness palpation proximal to the knee.  Left foot with some dorsal swelling.  Mild tenderness to palpation.  Assessment: Postop day 1 status post intramedullary nail of right open tibia and fibula fracture   Plan: Work with physical therapy for mobilization. Patient is touchdown weightbearing Lovenox for DVT prophylaxis Finished perioperative antibiotics.  Possible discharge today if cleared by physical therapy. Patient will follow up with me in 2 weeks.   Nicki Guadalajara, MD

## 2019-10-24 NOTE — Progress Notes (Addendum)
Nursing Note [facility]  Wife brought pt's phone, glasses to [facility]. Will bring this up with pt to his room on 5c rm 15.

## 2019-10-24 NOTE — Transfer of Care (Signed)
Immediate Anesthesia Transfer of Care Note  Patient: Alexander Stanley  Procedure(s) Performed: IRRIGATION AND DEBRIDEMENT, TIBIA (Right Leg Lower) INTRAMEDULLARY (IM) NAIL TIBIAL (Right Leg Lower) APPLICATION AND REMOVAL OF EXTERNAL FIXATION RIGHT  LOWER LEG (Right Leg Lower)  Patient Location: PACU  Anesthesia Type:General  Level of Consciousness: drowsy  Airway & Oxygen Therapy: Patient Spontanous Breathing and Patient connected to face mask oxygen  Post-op Assessment: Report given to RN and Post -op Vital signs reviewed and stable  Post vital signs: Reviewed and stable  Last Vitals:  Vitals Value Taken Time  BP 135/97 10/24/19 0233  Temp    Pulse 87 10/24/19 0236  Resp 17 10/24/19 0236  SpO2 93 % 10/24/19 0236  Vitals shown include unvalidated device data.  Last Pain:  Vitals:   10/23/19 2200  PainSc: 7          Complications: No complications documented.

## 2019-10-24 NOTE — Progress Notes (Signed)
Orthopedic Tech Progress Note Patient Details:  Alexander Stanley 21-May-1965 [id number]  Ortho Devices Type of Ortho Device: CAM walker Ortho Device/Splint Location: RLE Ortho Device/Splint Interventions: Application, Adjustment   Post Interventions Patient Tolerated: Well   [name] 10/24/2019, 2:50 AM

## 2019-10-25 MED ORDER — POLYETHYLENE GLYCOL 3350 17 G PO PACK
17.0000 g | PACK | Freq: Every day | ORAL | Status: DC
  Administered 2019-10-25 – 2019-10-26 (×2): 17 g via ORAL
  Filled 2019-10-25 (×2): qty 1

## 2019-10-25 NOTE — Progress Notes (Signed)
Inpatient Rehab Admissions Coordinator:   I met with patient at bedside to discuss options for rehab following d/c from acute care hospital. CIR was consulted for potential brief inpatient rehab stay. However, while pt. Does require rehab for safe home discharge, he does not demonstrate medical necessity for an inpatient stay. Discussed this with patient, and he stated understanding. CIR will sign off at this time. Thank you for this consult.   Clemens Catholic, [place], [place] Admissions Coordinator  [phone] ([place]) [phone] (office)

## 2019-10-25 NOTE — Progress Notes (Signed)
     Alexander Stanley is a 54 y.o. male   Orthopaedic diagnosis: Right open tibia and fibula fracture status post intramedullary nail  Subjective: Patient is resting comfortably today.  He is able to move his leg around much better.  He worked with physical therapy yesterday and they recommended further treatment and possible short-term inpatient rehab stay versus home with home health.  Patient states he may be interested in short-term rehab.  Objectyive: Vitals:   10/25/19 0015 10/25/19 0444  BP: (!) 130/104 126/89  Pulse: 92 84  Resp: 16 17  Temp: 98.6 F (37 C) 98.5 F (36.9 C)  SpO2: 91% 93%     Exam: Awake and alert Respirations even and unlabored No acute distress  Right leg in a boot.  Dressing in place.  Patient is able to mobilize his leg at the knee and hip better.  Left foot with mild tenderness on the dorsal lateral aspect of the foot.  This is improved.  Patient is able to wiggle his right toes and endorse sensation to light touch.  Assessment: Postop day 2 status post intramedullary nail of right tibia   Plan: I have consulted inpatient rehab. He will continue to work with physical therapy for disposition purposes Increase bowel regimen as patient has not had a bowel movement yet. Continue Lovenox for DVT prophylaxis Finished antibiotics Out of bed with assistance as tolerated.   Nicki Guadalajara, MD

## 2019-10-26 ENCOUNTER — Encounter (HOSPITAL_COMMUNITY): Payer: Self-pay | Admitting: Orthopaedic Surgery

## 2019-10-26 MED ORDER — OXYCODONE HCL 5 MG PO TABS: 5.0000 mg | ORAL_TABLET | ORAL | 0 refills | Status: DC | PRN

## 2019-10-26 MED ORDER — GABAPENTIN 300 MG PO CAPS: 300.0000 mg | ORAL_CAPSULE | Freq: Three times a day (TID) | ORAL | 0 refills | Status: DC

## 2019-10-26 MED ORDER — ENOXAPARIN SODIUM 40 MG/0.4ML ~~LOC~~ SOLN: 40.0000 mg | SUBCUTANEOUS | 0 refills | Status: DC

## 2019-10-26 MED ORDER — NAPROXEN 250 MG PO TABS: 250.0000 mg | ORAL_TABLET | Freq: Two times a day (BID) | ORAL | 0 refills | Status: AC

## 2019-10-26 MED ORDER — DOCUSATE SODIUM 100 MG PO CAPS: 100.0000 mg | ORAL_CAPSULE | Freq: Two times a day (BID) | ORAL | 0 refills | Status: DC

## 2019-10-26 NOTE — Progress Notes (Signed)
Physical Therapy Treatment Patient Details Name: Alexander Stanley MRN: [id number] DOB: February 14, 1966 Today's Date: 10/26/2019    History of Present Illness The pt is a 54 yo male presenting s/p IM nail of R open tibia and fibula fx. No significant PMH.    PT Comments    The pt is making good progress with safety and mobility at this time and would like to progress to home (with use of a WC and ramp to enter) rather than pursuing further inpatient rehab at this time and I am in agreement. The pt was able to demo good bed mobility and OOB transfers with minG for safety and use of RW. He was able to maintain WB precautions and had no LOB with the mobility. The pt was then educated on car transfers and he verbalized good understanding. He was then shown the WC components and was able to verbalize good understanding of how to use and adjust the different components of his WC to allow for safe mobility and transfers. The pt will continue to benefit from skilled PT to progress functional strength and dynamic stability to allow for safe transfers while maintaining TDWB, but will be safe to return home with use of WC and ramped entries at this time.     Follow Up Recommendations  Home health PT;Supervision for mobility/OOB     Equipment Recommendations   (pt has WC at home)    Recommendations for Other Services       Precautions / Restrictions Precautions Precautions: Fall Required Braces or Orthoses: Other Brace Other Brace: tall cam boot Restrictions Weight Bearing Restrictions: Yes RLE Weight Bearing: Touchdown weight bearing    Mobility  Bed Mobility Overal bed mobility: Needs Assistance Bed Mobility: Supine to Sit;Sit to Supine     Supine to sit: Min assist Sit to supine: Min guard   General bed mobility comments: minA to move RLE to EOB and slowly to floor, no assist for trunk elevation. Pt able to return BLE to bed with increased time, effort, and pain  Transfers Overall  transfer level: Needs assistance Equipment used: Rolling walker (2 wheeled) Transfers: Sit to/from [employer] Sit to Stand: Min guard Stand pivot transfers: Min guard       General transfer comment: minG for safety from elevated surface of bed. minG again from low WC with cues for hand placement and maintaining TDWB  Ambulation/Gait             General Gait Details: the pt was able to take small pivot steps to [facility], but session focused on WC use and transfers to allow for d/c home   Psychologist, counselling mobility: Yes Wheelchair propulsion: Both upper extremities Wheelchair parts: Supervision/cueing Distance: 10 Wheelchair Assistance Details (indicate cue type and reason): cues and instructions for use of breaks, leg rests. demoed movement/attachment multiple times to increase pt comfort.  Modified Rankin (Stroke Patients Only)       Balance                                            Cognition Arousal/Alertness: Awake/alert Behavior During Therapy: WFL for tasks assessed/performed Overall Cognitive Status: Within Functional Limits for tasks assessed  General Comments: agreeable through session. motivated      Exercises      General Comments        Pertinent Vitals/Pain Pain Assessment: Faces Faces Pain Scale: Hurts even more Pain Location: knee most, but RLE Pain Descriptors / Indicators: Grimacing;Sore;Heaviness Pain Intervention(s): Limited activity within patient's tolerance;Monitored during session;Repositioned           PT Goals (current goals can now be found in the care plan section) Acute Rehab PT Goals Patient Stated Goal: return to life as normal PT Goal Formulation: With patient/family Time For Goal Achievement: 11/07/19 Potential to Achieve Goals: Good Progress towards PT goals: Progressing toward  goals    Frequency    Min 4X/week      PT Plan Discharge plan needs to be updated       AM-PAC PT "6 Clicks" Mobility   Outcome Measure  Help needed turning from your back to your side while in a flat bed without using bedrails?: None Help needed moving from lying on your back to sitting on the side of a flat bed without using bedrails?: A Little Help needed moving to and from a bed to a chair (including a wheelchair)?: A Little Help needed standing up from a chair using your arms (e.g., wheelchair or bedside chair)?: A Little Help needed to walk in hospital room?: A Little Help needed climbing 3-5 steps with a railing? : Total 6 Click Score: 17    End of Session Equipment Utilized During Treatment: Gait belt;Other (comment) (CAM boot RLE) Activity Tolerance: Patient limited by fatigue;Patient limited by pain Patient left: in bed;with family/visitor present;with nursing/sitter in room;with call bell/phone within reach Nurse Communication: Mobility status;Weight bearing status PT Visit Diagnosis: Unsteadiness on feet (R26.81);Difficulty in walking, not elsewhere classified (R26.2);Pain Pain - Right/Left: Right Pain - part of body: Knee;Leg     Time: 3220-2542 PT Time Calculation (min) (ACUTE ONLY): 36 min  Charges:  $Gait Training: 8-22 mins $Self Care/Home Management: 8-22                     Rolm Baptise, PT, DPT   Acute Rehabilitation Department Pager #: [phone]   Gaetana Michaelis 10/26/2019, 2:03 PM

## 2019-10-26 NOTE — TOC CAGE-AID Note (Signed)
Transition of Care [facility]) - CAGE-AID Screening   Patient Details  Name: Alexander Stanley MRN: [id number] Date of Birth: [birth year]  Transition of Care [facility]) CM/SW Contact:    Emeterio Reeve, [place] Phone Number: 10/26/2019, 3:59 PM   Clinical Narrative:  CSW met with pt at bedside. CSW introduced self and explained her role at the hospital.  PT denies alcohol use and substance use. Pt did not need any resources at this time.    CAGE-AID Screening:    Have You Ever Felt You Ought to Cut Down on Your Drinking or Drug Use?: No Have People Annoyed You By Critizing Your Drinking Or Drug Use?: No Have You Felt Bad Or Guilty About Your Drinking Or Drug Use?: No Have You Ever Had a Drink or Used Drugs First Thing In The Morning to Steady Your Nerves or to Get Rid of a Hangover?: No CAGE-AID Score: 0  Substance Abuse Education Offered: Yes    Blima Ledger, [place] Social Worker [phone]

## 2019-10-26 NOTE — TOC Initial Note (Addendum)
Transition of Care [facility]) - Initial/Assessment Note    Patient Details  Name: Alexander Stanley MRN: [id number] Date of Birth: 11-Apr-1965  Transition [facility] Care [facility]) CM/SW Contact:    Kingsley Plan, RN Phone Number: 10/26/2019, 11:52 AM  Clinical Narrative:                 Talked to patient at bedside, and wife via face time. Discussed PT recommendations. Patient and wife prefer to go home with [facility] ( provided Medicare.gov list ).   They already have wheel chair , walker , shower seat 3 in1 .   Pt getting ready to work with patient. Will come back after. To see transfers. Going home via car vs Lou Miner with [facility] checking to see if she can accept referral. Clydie Braun returned call and accepted referral   Update 1315, spoke with patient vis phone. After working with PT he feels comfortable going home by private car.   Expected Discharge Plan: Home w Home Health Services Barriers to Discharge: Continued Medical Work up   Patient Goals and CMS Choice Patient states their goals for this hospitalization and ongoing recovery are:: to return to home CMS Medicare.gov Compare Post Acute Care list provided to:: Patient Choice offered to / list presented to : Patient, Spouse  Expected Discharge Plan and Services Expected Discharge Plan: Home w Home Health Services In-house Referral: Clinical Social Work Discharge Planning Services: CM Consult Post Acute Care Choice: Home Health Living arrangements for the past 2 months: Single Family Home Expected Discharge Date: 10/26/19               DME Arranged: N/A DME Agency: NA       HH Arranged: PT, OT HH Agency: [facility] Date HH Agency Contacted: 10/26/19 Time HH Agency Contacted: 1150 Representative spoke with at [facility] Agency: Clydie Braun, awaiting call back  Prior Living Arrangements/Services Living arrangements for the past 2 months: Single Family Home Lives with:: Spouse Patient language and need for  interpreter reviewed:: Yes Do you feel safe going back to the place where you live?: Yes      Need for Family Participation in Patient Care: Yes (Comment) Care giver support system in place?: Yes (comment) Current home services: DME Criminal Activity/Legal Involvement Pertinent to Current Situation/Hospitalization: No - Comment as needed  Activities of Daily Living      Permission Sought/Granted Permission sought to share information with : Family Supports Permission granted to share information with : Yes, Verbal Permission Granted  Share Information with NAME: wife Gavin Pound  Permission granted to share info w AGENCY: [facility]  Permission granted to share info w Relationship: wife  Permission granted to share info w Contact Information: [phone]  Emotional Assessment Appearance:: Appears stated age Attitude/Demeanor/Rapport: Engaged, Gracious Affect (typically observed): Accepting, Adaptable, Appropriate Orientation: : Oriented to Self, Oriented to Place, Oriented to  Time, Oriented to Situation Alcohol / Substance Use: Not Applicable Psych Involvement: No (comment)  Admission diagnosis:  Closed nondisplaced fracture of navicular bone of left foot, initial encounter [S92.255A] Fall, initial encounter [W19.XXXA] Type I or II open fracture of right tibia and fibula, initial encounter [S82.201B, S82.401B] Tibia and fibula open fracture, right [S82.201B, S82.401B] Patient Active Problem List   Diagnosis Date Noted  . Tibia and fibula open fracture, right 10/24/2019   PCP:  Ozella Rocks, MD Pharmacy:   [facility] [address] [place] - [address] [place] [address]  Phone: [phone] Fax: [phone]     Social Determinants of Health (SDOH) Interventions    Readmission Risk Interventions Readmission Risk Prevention Plan 10/26/2019  Post Dischage Appt Not Complete  Appt Comments pt to schedule   Medication Screening Complete  Transportation Screening Complete

## 2019-10-26 NOTE — Discharge Summary (Signed)
Patient ID: Alexander Stanley MRN: [id number] DOB/AGE: [birth year] 54 y.o.  Admit date: 10/23/2019 Discharge date: 10/26/2019  Admission Diagnoses:  Active Problems:   Tibia and fibula open fracture, right   Discharge Diagnoses:  Same  Past Medical History:  Diagnosis Date   Hypertension     Surgeries: Procedure(s): IRRIGATION AND DEBRIDEMENT, TIBIA INTRAMEDULLARY (IM) NAIL TIBIAL APPLICATION AND REMOVAL OF EXTERNAL FIXATION RIGHT  LOWER LEG on 10/23/2019 - 10/24/2019   Consultants:   Discharged Condition: Improved  Hospital Course: Alexander Stanley is an 54 y.o. male who was admitted 10/23/2019 for operative treatment of<principal problem not specified>. Patient has severe unremitting pain that affects sleep, daily activities, and work/hobbies. After pre-op clearance the patient was taken to the operating room on 10/23/2019 - 10/24/2019 and underwent  Procedure(s): IRRIGATION AND DEBRIDEMENT, TIBIA INTRAMEDULLARY (IM) NAIL TIBIAL APPLICATION AND REMOVAL OF EXTERNAL FIXATION RIGHT  LOWER LEG.    Patient did well postoperatively.  On postoperative day 3 he was indicated for discharge.  He worked with physical therapy and did well.  He was evaluated by CIR while in hospital and not deemed suitable for inpatient rehab.  Right leg with short leg walking boot in place.  Toes are warm and well-perfused with intact sensation.  No tenderness proximal to the splint.  Patient was given perioperative antibiotics:  Anti-infectives (From admission, onward)   Start     Dose/Rate Route Frequency Ordered Stop   10/24/19 0600  ceFAZolin (ANCEF) IVPB 1 g/50 mL premix        1 g 100 mL/hr over 30 Minutes Intravenous Every 6 hours 10/24/19 0401 10/24/19 2359   10/24/19 0200  vancomycin (VANCOCIN) powder  Status:  Discontinued          As needed 10/24/19 0206 10/24/19 0229       Patient was given sequential compression devices, early ambulation, and chemoprophylaxis to prevent DVT.  Patient  benefited maximally from hospital stay and there were no complications.    Recent vital signs:  Patient Vitals for the past 24 hrs:  BP Temp Temp src Pulse Resp SpO2  10/26/19 0534 (!) 133/105 98 F (36.7 C) Oral 82 18 95 %  10/25/19 2117 (!) 142/114 98.4 F (36.9 C) Oral 94 18 95 %  10/25/19 1830 (!) 142/106 98.3 F (36.8 C) Oral 92 18 98 %  10/25/19 1211 (!) 143/99 -- Oral 90 18 98 %     Recent laboratory studies:  Recent Labs    10/23/19 2034  WBC 11.4*  HGB 15.4  HCT 48.2  PLT 189  NA 136  K 3.6  CL 103  CO2 24  BUN 27*  CREATININE 0.95  GLUCOSE 111*  INR 1.0  CALCIUM 9.0     Discharge Medications:   Allergies as of 10/26/2019      Reactions   Kiwi Extract Anaphylaxis   Oysters [shellfish Allergy] Nausea And Vomiting   Effexor [venlafaxine] Palpitations      Medication List    TAKE these medications   B-complex with vitamin C tablet Take 1 tablet by mouth daily.   docusate sodium 100 MG capsule Commonly known as: COLACE Take 1 capsule (100 mg total) by mouth 2 (two) times daily.   enoxaparin 40 MG/0.4ML injection Commonly known as: LOVENOX Inject 0.4 mLs (40 mg total) into the skin daily for 14 days. Start taking on: October 27, 2019   gabapentin 300 MG capsule Commonly known as: NEURONTIN Take 1 capsule (300 mg total) by mouth  3 (three) times daily.   hydrochlorothiazide 25 MG tablet Commonly known as: HYDRODIURIL Take 25 mg by mouth daily.   lisinopril 20 MG tablet Commonly known as: ZESTRIL Take 20 mg by mouth at bedtime.   multivitamin with minerals Tabs tablet Take 1 tablet by mouth daily.   naproxen 250 MG tablet Commonly known as: NAPROSYN Take 1 tablet (250 mg total) by mouth 2 (two) times daily with a meal.   oxyCODONE 5 MG immediate release tablet Commonly known as: Oxy IR/ROXICODONE Take 1-2 tablets (5-10 mg total) by mouth every 4 (four) hours as needed for moderate pain (pain score 4-6).   pantoprazole 40 MG  tablet Commonly known as: PROTONIX Take 40 mg by mouth daily.       Diagnostic Studies: DG Tibia/Fibula Right  Result Date: 10/24/2019 CLINICAL DATA:  Tibial ORIF, intraoperative examination EXAM: DG C-ARM 1-60 MIN; RIGHT TIBIA AND FIBULA - 2 VIEW CONTRAST:  None FLUOROSCOPY TIME:  Fluoroscopy Time:  1 minutes 55 seconds Radiation Exposure Index (if provided by the fluoroscopic device): 6.88 mGy Number of Acquired Spot Images: 7 COMPARISON:  None. FINDINGS: Fluoroscopic intraoperative radiographs demonstrate ORIF of a comminuted distal tibial diaphyseal fracture with an intramedullary rod with proximal and distal interlocking screws as well as fixation of the prominent butterfly fragment with a medial cortical plate and screws. Fracture fragments are in grossly anatomic alignment on this limited examination. Fibular fracture fragments are in near anatomic alignment with mild posterior displacement of the a butterfly fragment. No unexpected fracture or dislocation. IMPRESSION: Intraoperative radiographs as described above. Electronically Signed   By: Helyn Numbers MD   On: 10/24/2019 03:11   DG Tibia/Fibula Right Port  Result Date: 10/23/2019 CLINICAL DATA:  Larey Seat EXAM: PORTABLE RIGHT TIBIA AND FIBULA - 2 VIEW COMPARISON:  None. FINDINGS: Frontal and lateral views of the right tibia and fibula are obtained. There are comminuted oblique fractures through the distal right tibial and fibular diaphyses, with dorsal and lateral angulation at the fracture site. The ankle mortise appears intact. Right knee is unremarkable. IMPRESSION: 1. Comminuted oblique fractures of the distal right tibial and fibular diaphyses. Dorsal and lateral angulation. Electronically Signed   By: Sharlet Salina M.D.   On: 10/23/2019 21:08   DG Foot Complete Left  Result Date: 10/23/2019 CLINICAL DATA:  Larey Seat EXAM: LEFT FOOT - COMPLETE 3+ VIEW COMPARISON:  None. FINDINGS: Frontal and lateral views of the left foot are obtained.  Small ossific densities project off of the dorsal margin of the tarsal navicular, consistent with small avulsion fractures. There is overlying soft tissue edema. No other acute bony abnormalities. Joint spaces are well preserved. IMPRESSION: 1. Small avulsion fractures off the dorsal margin of the tarsal navicular, with overlying soft tissue edema. Electronically Signed   By: Sharlet Salina M.D.   On: 10/23/2019 21:08   DG C-Arm 1-60 Min  Result Date: 10/24/2019 CLINICAL DATA:  Tibial ORIF, intraoperative examination EXAM: DG C-ARM 1-60 MIN; RIGHT TIBIA AND FIBULA - 2 VIEW CONTRAST:  None FLUOROSCOPY TIME:  Fluoroscopy Time:  1 minutes 55 seconds Radiation Exposure Index (if provided by the fluoroscopic device): 6.88 mGy Number of Acquired Spot Images: 7 COMPARISON:  None. FINDINGS: Fluoroscopic intraoperative radiographs demonstrate ORIF of a comminuted distal tibial diaphyseal fracture with an intramedullary rod with proximal and distal interlocking screws as well as fixation of the prominent butterfly fragment with a medial cortical plate and screws. Fracture fragments are in grossly anatomic alignment on this limited examination. Fibular  fracture fragments are in near anatomic alignment with mild posterior displacement of the a butterfly fragment. No unexpected fracture or dislocation. IMPRESSION: Intraoperative radiographs as described above. Electronically Signed   By: Helyn Numbers MD   On: 10/24/2019 03:11    Disposition: Discharge disposition: 01-Home or Self Care       Discharge Instructions    Call MD / Call 911   Complete by: As directed    If you experience chest pain or shortness of breath, CALL 911 and be transported to the hospital emergency room.  If you develope a fever above 101 F, pus (white drainage) or increased drainage or redness at the wound, or calf pain, call your surgeon's office.   Constipation Prevention   Complete by: As directed    Drink plenty of fluids.  Prune  juice may be helpful.  You may use a stool softener, such as Colace (over the counter) 100 mg twice a day.  Use MiraLax (over the counter) for constipation as needed.   Diet - low sodium heart healthy   Complete by: As directed    Increase activity slowly as tolerated   Complete by: As directed    TDWB RLE       Follow-up Information    Terance Hart, MD. Schedule an appointment as soon as possible for a visit in 2 weeks.   Specialty: Orthopedic Surgery Contact information: [address] [place] [address] [phone]                Signed: Terance Hart 10/26/2019, 11:35 AM

## 2019-10-26 NOTE — TOC Initial Note (Signed)
Transition of Care [facility]) - Initial/Assessment Note    Patient Details  Name: Alexander Stanley MRN: [id number] Date of Birth: 09-02-1965  Transition of Care [facility]) CM/SW Contact:    Doy Hutching, LCSW Phone Number: 10/26/2019, 11:46 AM  Clinical Narrative:                 CSW spoke with pt at bedside. Introduced self, role, reason for visit. Confirmed home address and PCP. Pt from home with spouse Gavin Pound, who is a Lawyer. He was cleaning out his trailer after a trip to the beach and slipped down the stairs. He is s/p repair. We discussed recommendations- he has spoken with his wife and Dr. Susa Simmonds and states he would like to return home with [facility], no additional DME needs as he has what he needs at home. Pt states he is interested in d/c today if he is able. He understands his insurance may dictate which HH agency able to accept referral. CSW spoke with [facility] who will assist with f/u [facility] and orders.   Expected Discharge Plan: Home w Home Health Services Barriers to Discharge: Continued Medical Work up   Patient Goals and CMS Choice Patient states their goals for this hospitalization and ongoing recovery are:: return home with home health instead of rehab/CIR CMS Medicare.gov Compare Post Acute Care list provided to:: Patient Choice offered to / list presented to : Patient  Expected Discharge Plan and Services Expected Discharge Plan: Home w Home Health Services In-house Referral: Clinical Social Work Discharge Planning Services: CM Consult Post Acute Care Choice: Home Health Living arrangements for the past 2 months: Single Family Home Expected Discharge Date: 10/26/19                Prior Living Arrangements/Services Living arrangements for the past 2 months: Single Family Home Lives with:: Spouse Patient language and need for interpreter reviewed:: Yes (no needs) Do you feel safe going back to the place where you live?: Yes      Need for Family Participation in Patient Care: Yes  (Comment) (assistance as needed) Care giver support system in place?: Yes (comment) (spouse) Current home services: DME Criminal Activity/Legal Involvement Pertinent to Current Situation/Hospitalization: No - Comment as needed  Permission Sought/Granted Permission sought to share information with : Family Supports Permission granted to share information with : Yes, Verbal Permission Granted  Share Information with NAME: Satira Mccallum  Permission granted to share info w AGENCY: HH  Permission granted to share info w Relationship: wife  Permission granted to share info w Contact Information: [phone]  Emotional Assessment Appearance:: Appears stated age Attitude/Demeanor/Rapport: Engaged, Gracious Affect (typically observed): Accepting, Adaptable, Appropriate Orientation: : Oriented to Situation, Oriented to  Time, Oriented to Place, Oriented to Self Alcohol / Substance Use: Not Applicable Psych Involvement: No (comment)  Admission diagnosis:  Closed nondisplaced fracture of navicular bone of left foot, initial encounter [S92.255A] Fall, initial encounter [W19.XXXA] Type I or II open fracture of right tibia and fibula, initial encounter [S82.201B, S82.401B] Tibia and fibula open fracture, right [S82.201B, S82.401B] Patient Active Problem List   Diagnosis Date Noted  . Tibia and fibula open fracture, right 10/24/2019   PCP:  Ozella Rocks, MD Pharmacy:   [facility] [address], [place] - [address] [place] [address] Phone: [phone] Fax: [phone]   Readmission Risk Interventions Readmission Risk Prevention Plan 10/26/2019  Post Dischage Appt Not Complete  Appt Comments pt to schedule  Medication Screening  Complete  Transportation Screening Complete

## 2019-10-26 NOTE — Discharge Instructions (Signed)
Intramedullary Nailing of Tibial Diaphyseal Fracture, Care After This sheet gives you information about how to care for yourself after your procedure. Your health care provider may also give you more specific instructions. If you have problems or questions, contact your health care provider. What can I expect after the procedure? After the procedure, it is common to have:  Pain and swelling in your lower leg. Follow these instructions at home: Medicines  Take over-the-counter and prescription medicines only as told by your health care provider.  If you were prescribed an antibiotic medicine, take it as told by your health care provider. Do not stop taking the antibiotic even if you start to feel better.  Do not drive or use heavy machinery while taking prescription medicine or until your health care provider approves. If you have a splint:  Wear the splint as told by your health care provider. Remove it only as told by your health care provider.  Loosen the splint if your toes tingle, become numb, or turn cold and blue.  Keep the splint clean.  If the splint is not waterproof: ? Do not let it get wet. ? Cover it with a watertight covering when you take a shower. Bathing  Do not take baths, swim, or use a hot tub until your health care provider approves. You may only be allowed to take sponge baths for bathing for the first few days.  Ask your health care provider if you can take showers.  Keep your bandage (dressing) dry until your health care provider says it can be removed. Incision care   Follow instructions from your health care provider about how to take care of your incision. Make sure you: ? Wash your hands with soap and water before you change your bandage (dressing). If soap and water are not available, use hand sanitizer. ? Change your dressing as told by your health care provider. ? Leave stitches (sutures), skin glue, or adhesive strips in place. These skin closures  may need to stay in place for 2 weeks or longer. If adhesive strip edges start to loosen and curl up, you may trim the loose edges. Do not remove adhesive strips completely unless your health care provider tells you to do that.  Check your incision area every day for signs of infection. Check for: ? More redness, swelling, or pain. ? More fluid or blood. ? Warmth. ? Pus or a bad smell. Managing pain, stiffness, and swelling   Raise (elevate) the injured area above the level of your heart while you are sitting or lying down.  Move your toes often to avoid stiffness and to lessen swelling.  If directed, put ice on the injured area. ? Put ice in a plastic bag. ? Place a towel between your skin and the bag. ? Leave the ice on for 20 minutes, 2-3 times a day. Activity  Do not use the injured limb to support your body weight. You may be able to put some weight on your leg (partial weight-bearing). Use crutches or a walker. Follow directions as told by your healthcare provider.  Avoid sitting or lying for a long time without moving. You will be encouraged to walk with assistance as soon as you can. This will help prevent blood clots.  If physical therapy was prescribed, do exercises as directed. It is important to do physical therapy to regain muscle strength and range of motion in your leg.  Rest and avoid activities that require a lot of effort or   put you at risk for a fall or another leg injury. Ask your health care provider what activities are safe for you and when you can begin leg motion after surgery. General instructions  To prevent or treat constipation while you are taking prescription pain medicine, your health care provider may recommend that you: ? Drink enough fluid to keep your urine clear or pale yellow. ? Take over-the-counter or prescription medicines. ? Eat foods that are high in fiber, such as fresh fruits and vegetables, whole grains, and beans. ? Limit foods that are  high in fat and processed sugars, such as fried and sweet foods.  Do not use any products that contain nicotine or tobacco, such as cigarettes and e-cigarettes. These can delay bone healing. If you need help quitting, ask your health care provider.  Take steps to prevent falls at home, such as removing throw rugs and tripping hazards.  Keep all follow-up visits as told by your health care provider. This is important. Contact a health care provider if:  You have more redness, swelling, or pain around your incision.  You have more fluid or blood coming from your incision.  Your incision feels warm to the touch.  You have pus or a bad smell coming from your incision.  You have a fever. Get help right away if:  Your incision breaks open.  You have red, painful, or swollen areas in one or both legs.  You have severe pain that does not get better with medicine. Summary  After your procedure, it is common to have pain and swelling in your lower leg.  Check your incision area every day for signs of infection, such as more redness or swelling.  Do not use the injured limb to support your body weight. You may be able to put some weight on your leg (partial weight-bearing). Use crutches or a walker. Follow directions as told by your healthcare provider.  Ask your health care provider what activities are safe for you while you recover. It is important to do physical therapy as instructed to regain strength and range of motion in your leg.  Keep all follow-up visits as told by your health care provider. This is important. This information is not intended to replace advice given to you by your health care provider. Make sure you discuss any questions you have with your health care provider. Document Revised: 02/24/2018 Document Reviewed: 04/25/2016 Elsevier Patient Education  2020 [employer]  

## 2019-10-27 ENCOUNTER — Encounter: Payer: Self-pay | Admitting: Gastroenterology

## 2019-11-16 ENCOUNTER — Other Ambulatory Visit (HOSPITAL_COMMUNITY): Payer: Self-pay | Admitting: Orthopaedic Surgery

## 2019-11-16 ENCOUNTER — Ambulatory Visit (HOSPITAL_COMMUNITY)
Admission: RE | Admit: 2019-11-16 | Discharge: 2019-11-16 | Disposition: A | Discharge status: Home / Self Care | Payer: 59 | Source: Ambulatory Visit | Attending: Orthopaedic Surgery | Admitting: Orthopaedic Surgery

## 2019-11-16 ENCOUNTER — Other Ambulatory Visit: Payer: Self-pay

## 2019-11-16 DIAGNOSIS — M79604 Pain in right leg: Secondary | ICD-10-CM | POA: Diagnosis not present

## 2021-04-04 ENCOUNTER — Ambulatory Visit (INDEPENDENT_AMBULATORY_CARE_PROVIDER_SITE_OTHER): Payer: 59 | Admitting: Urology

## 2021-04-04 ENCOUNTER — Encounter: Payer: Self-pay | Admitting: Urology

## 2021-04-04 ENCOUNTER — Other Ambulatory Visit: Payer: Self-pay

## 2021-04-04 VITALS — BP 134/93 | HR 97 | Wt 240.0 lb

## 2021-04-04 DIAGNOSIS — N400 Enlarged prostate without lower urinary tract symptoms: Secondary | ICD-10-CM | POA: Diagnosis not present

## 2021-04-04 DIAGNOSIS — N200 Calculus of kidney: Secondary | ICD-10-CM

## 2021-04-04 DIAGNOSIS — N410 Acute prostatitis: Secondary | ICD-10-CM

## 2021-04-04 DIAGNOSIS — N5201 Erectile dysfunction due to arterial insufficiency: Secondary | ICD-10-CM | POA: Diagnosis not present

## 2021-04-04 MED ORDER — SILDENAFIL CITRATE 100 MG PO TABS: ORAL_TABLET | ORAL | 99 refills | Status: DC

## 2021-04-04 NOTE — Progress Notes (Signed)
Urological Symptom Review  Patient is experiencing the following symptoms: Get up at night to urinate Stream starts and stops Erection problems (male only)   Review of Systems  Gastrointestinal (upper)  : Indigestion/heartburn  Gastrointestinal (lower) : Negative for lower GI symptoms  Constitutional : Negative for symptoms  Skin: Negative for skin symptoms  Eyes: Negative for eye symptoms  Ear/Nose/Throat : Negative for Ear/Nose/Throat symptoms  Hematologic/Lymphatic: Negative for Hematologic/Lymphatic symptoms  Cardiovascular : Negative for cardiovascular symptoms  Respiratory : Negative for respiratory symptoms  Endocrine: Negative for endocrine symptoms  Musculoskeletal: Negative for musculoskeletal symptoms  Neurological: Negative for neurological symptoms  Psychologic: Negative for psychiatric symptoms

## 2021-04-04 NOTE — Progress Notes (Signed)
H&P  Chief Complaint: Consultation for multiple urologic issues  History of Present Illness: 56 year old male basically self-referred for evaluation and management of several past and ongoing urologic issues.  1.  The patient does have an extensive history of urolithiasis.  [facility] by Dr. Oda KiltsSigman Tannenbaum.  Last seen in 2012.  The patient has a history of a large right renal calculus and a ureteral calculus, both treated with shockwave lithotripsy.  The larger stone in the right kidney was approximately 16 mm in size.  Last seen in 2012.  He did have stone analysis revealing calcium oxalate and calcium phosphate.  He had normal serum calciums in the past.  He has not had a 24-hour urine collection.  Recently had symptoms of prostatitis.  Treated with sulfa and then a azithromycin.  Symptoms of prostatitis resolved with passage of a small stone.  He had a prior episode of prostatitis about 10 years ago.  He does drink lots of water and also supplements this with citrate.  Early in this presentation he did have urinary retention necessitating Foley catheter.  He also had a bladder calculus.  2.  The patient does have mild problems obtaining and maintaining erections.  He is not on any medical therapy for this.  3.  He does have urinary frequency but he feels like this is most likely related to increased fluid intake.  Past Medical History:  Diagnosis Date   ADD (attention deficit disorder)    Anxiety    Asthma    as teen had exersize induced asthma - no problem as adult   Cough    GERD (gastroesophageal reflux disease)    History of kidney stones    Hypertension    Umbilical hernia     Past Surgical History:  Procedure Laterality Date   CARDIAC CATHETERIZATION     CYSTO     x 2 for kidney stone   EXTERNAL FIXATION LEG Right 10/23/2019   Procedure: APPLICATION AND REMOVAL OF EXTERNAL FIXATION RIGHT  LOWER LEG;  Surgeon: Terance HartAdair, Christopher R, MD;   [facility];  Service: Orthopedics;  Laterality: Right;   I & D EXTREMITY Right 10/23/2019   Procedure: IRRIGATION AND DEBRIDEMENT, TIBIA;  Surgeon: Terance HartAdair, Christopher R, MD;  [facility];  Service: Orthopedics;  Laterality: Right;   INSERTION OF MESH N/A 02/19/2014   Procedure: INSERTION OF MESH;  Surgeon: Avel Peaceodd Rosenbower, MD;  Location: [facility] ORS;  Service: General;  Laterality: N/A;   TIBIA IM NAIL INSERTION Right 10/23/2019   Procedure: INTRAMEDULLARY (IM) NAIL TIBIAL;  Surgeon: Terance HartAdair, Christopher R, MD;  [facility];  Service: Orthopedics;  Laterality: Right;   TONSILLECTOMY     UMBILICAL HERNIA REPAIR N/A 02/19/2014   Procedure: HERNIA REPAIR UMBILICAL ADULT WITH MESH;  Surgeon: Avel Peaceodd Rosenbower, MD;  Location: [facility] ORS;  Service: General;  Laterality: N/A;   WISDOM TOOTH EXTRACTION      Home Medications:  Allergies as of 04/04/2021       Reactions   Kiwi Extract Anaphylaxis   Kiwi Extract Anaphylaxis   Effexor [venlafaxine]    "felt like was having a heart attack"   Oysters [shellfish Allergy] Nausea And Vomiting   Oysters [shellfish Allergy] Nausea And Vomiting   Effexor [venlafaxine] Palpitations        Medication List        Accurate as of April 04, 2021  8:00 AM. If you have any questions, ask your nurse or doctor.  B-complex with vitamin C tablet Take 1 tablet by mouth daily.   diazepam 5 MG tablet Commonly known as: VALIUM Take 5 mg by mouth every 6 (six) hours as needed for anxiety.   docusate sodium 100 MG capsule Commonly known as: COLACE Take 1 capsule (100 mg total) by mouth 2 (two) times daily.   enoxaparin 40 MG/0.4ML injection Commonly known as: LOVENOX Inject 0.4 mLs (40 mg total) into the skin daily for 14 days.   gabapentin 300 MG capsule Commonly known as: NEURONTIN Take 1 capsule (300 mg total) by mouth 3 (three) times daily.   hydrochlorothiazide 25 MG tablet Commonly known as: HYDRODIURIL Take 25 mg by mouth at  bedtime.   hydrochlorothiazide 25 MG tablet Commonly known as: HYDRODIURIL Take 25 mg by mouth daily.   lisinopril 20 MG tablet Commonly known as: ZESTRIL Take 20 mg by mouth daily as needed.   lisinopril 20 MG tablet Commonly known as: ZESTRIL Take 20 mg by mouth at bedtime.   multivitamin with minerals Tabs tablet Take 1 tablet by mouth daily.   naproxen sodium 220 MG tablet Commonly known as: ALEVE Take 220-440 mg by mouth 2 (two) times daily as needed (Pain).   oxyCODONE 5 MG immediate release tablet Commonly known as: Oxy IR/ROXICODONE Take 1-2 tablets (5-10 mg total) by mouth every 4 (four) hours as needed for moderate pain (pain score 4-6).   pantoprazole 40 MG tablet Commonly known as: PROTONIX Take 40 mg by mouth 2 (two) times daily as needed.   pantoprazole 40 MG tablet Commonly known as: PROTONIX Take 40 mg by mouth daily.        Allergies:  Allergies  Allergen Reactions   Kiwi Extract Anaphylaxis   Kiwi Extract Anaphylaxis   Effexor [Venlafaxine]     "felt like was having a heart attack"   Oysters [Shellfish Allergy] Nausea And Vomiting   Oysters [Shellfish Allergy] Nausea And Vomiting   Effexor [Venlafaxine] Palpitations    Family History  Problem Relation Age of Onset   Diabetes Mother    Diabetes Father    Colon cancer Neg Hx     Social History:  reports that he has never smoked. He has never used smokeless tobacco. He reports that he does not drink alcohol and does not use drugs.  ROS: A complete review of systems was performed.  All systems are negative except for pertinent findings as noted.  Physical Exam:  Vital signs in last 24 hours: There were no vitals taken for this visit. Constitutional:  Alert and oriented, No acute distress Cardiovascular: Regular rate  Respiratory: Normal respiratory effort GI: Abdomen is soft, nontender, nondistended, no abdominal masses. No CVAT.  There are no inguinal hernias. Genitourinary: Normal  male phallus, testes are descended bilaterally and non-tender and without masses, scrotum is normal in appearance without lesions or masses, perineum is normal on inspection.  Elongated anal canal, I can only feel the apex of the prostate but this feels normal. Lymphatic: No lymphadenopathy Neurologic: Grossly intact, no focal deficits Psychiatric: Normal mood and affect  I have reviewed prior pt notes  I have reviewed notes from referring/previous physicians-[facility] notes reviewed  I have reviewed urinalysis results  I have independently reviewed prior imaging-I reviewed most recent KUB imaging with the patient.  Also prior CT scan transcriptions reviewed.     Impression/Assessment:  1.  History of urolithiasis, extensive.  Most recent stone episode was in the past few weeks, initially felt to be prostatitis.  Patient now asymptomatic, urine clear.  Last imaging was in 2012 showing a 2 mm left renal stone.  He has not had a metabolic evaluation  2.  Erectile dysfunction, mild  3.  Screening for prostate cancer.  Normal prostate exam but he has not had a PSA that I can see    Plan:  1.  I will check PSA today  2.  Send out the patient with 24-hour urine request.  We will notify him of results and any dietary modifications if necessary  3.  KUB will be performed as well  4.  I sent in a prescription for sildenafil for as needed use  5.  Follow-up dependent on above results.  I spent a total of 67 minutes reviewing the patient's old medical records from [facility], reviewing old images, interviewing the patient and planning on testing

## 2021-04-05 LAB — URINALYSIS, ROUTINE W REFLEX MICROSCOPIC
Bilirubin, UA: NEGATIVE
Glucose, UA: NEGATIVE
Ketones, UA: NEGATIVE
Leukocytes,UA: NEGATIVE
Nitrite, UA: NEGATIVE
Protein,UA: NEGATIVE
RBC, UA: NEGATIVE
Specific Gravity, UA: 1.02 (ref 1.005–1.030)
Urobilinogen, Ur: 0.2 mg/dL (ref 0.2–1.0)
pH, UA: 7 (ref 5.0–7.5)

## 2021-04-05 LAB — PSA: Prostate Specific Ag, Serum: 0.7 ng/mL (ref 0.0–4.0)

## 2022-01-19 DIAGNOSIS — M79632 Pain in left forearm: Secondary | ICD-10-CM | POA: Diagnosis not present

## 2022-01-19 DIAGNOSIS — M25532 Pain in left wrist: Secondary | ICD-10-CM | POA: Diagnosis not present

## 2022-01-19 DIAGNOSIS — M25461 Effusion, right knee: Secondary | ICD-10-CM | POA: Diagnosis not present

## 2022-01-19 DIAGNOSIS — M25522 Pain in left elbow: Secondary | ICD-10-CM | POA: Diagnosis not present

## 2022-01-19 DIAGNOSIS — M25561 Pain in right knee: Secondary | ICD-10-CM | POA: Diagnosis not present

## 2022-01-24 DIAGNOSIS — M25522 Pain in left elbow: Secondary | ICD-10-CM | POA: Diagnosis not present

## 2022-02-05 DIAGNOSIS — M25522 Pain in left elbow: Secondary | ICD-10-CM | POA: Diagnosis not present

## 2022-02-21 DIAGNOSIS — M25522 Pain in left elbow: Secondary | ICD-10-CM | POA: Diagnosis not present

## 2022-02-21 DIAGNOSIS — M25561 Pain in right knee: Secondary | ICD-10-CM | POA: Diagnosis not present

## 2022-03-10 IMAGING — RF DG TIBIA/FIBULA 2V*R*
1 series · 7 of 7 positions shown · IV contrast (agent unspecified)
Comparison: None.

CLINICAL DATA: Tibial ORIF, intraoperative examination

EXAM:
DG C-ARM 1-60 MIN; RIGHT TIBIA AND FIBULA - 2 VIEW
CONTRAST:  None
FLUOROSCOPY TIME:  Fluoroscopy Time:  1 minutes 55 seconds
Radiation Exposure Index (if provided by the fluoroscopic device):
6.88 mGy
Number of Acquired Spot Images: 7

[Series 1: run · 7 of 7 slices shown]
[im 1/7]
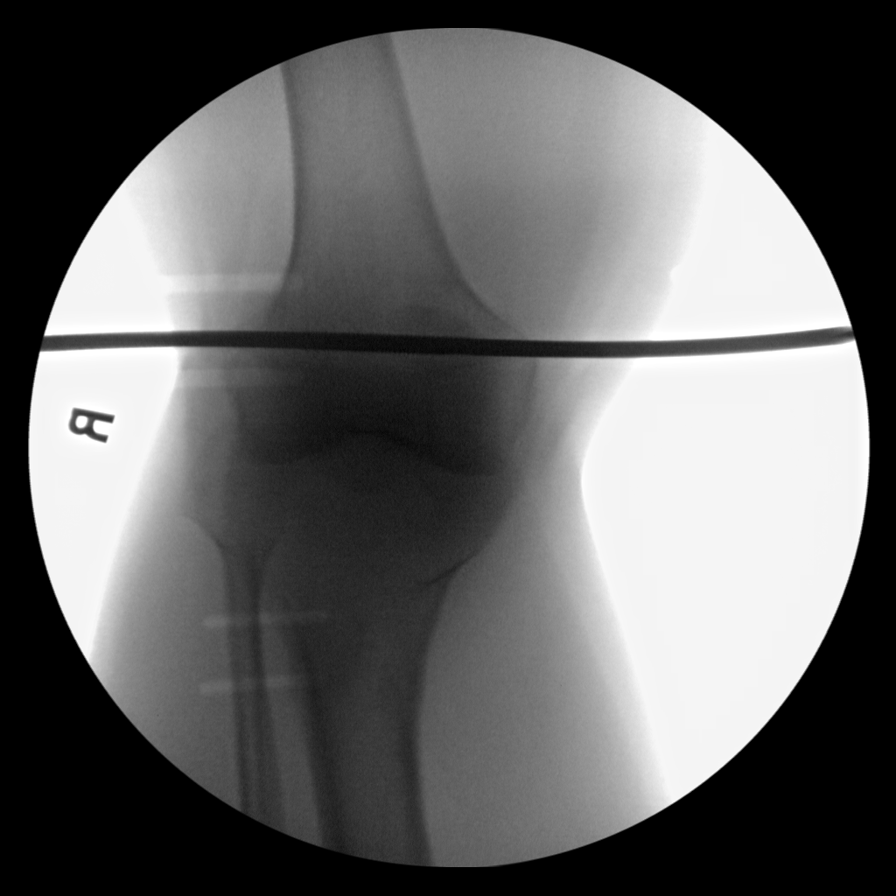
[im 2/7]
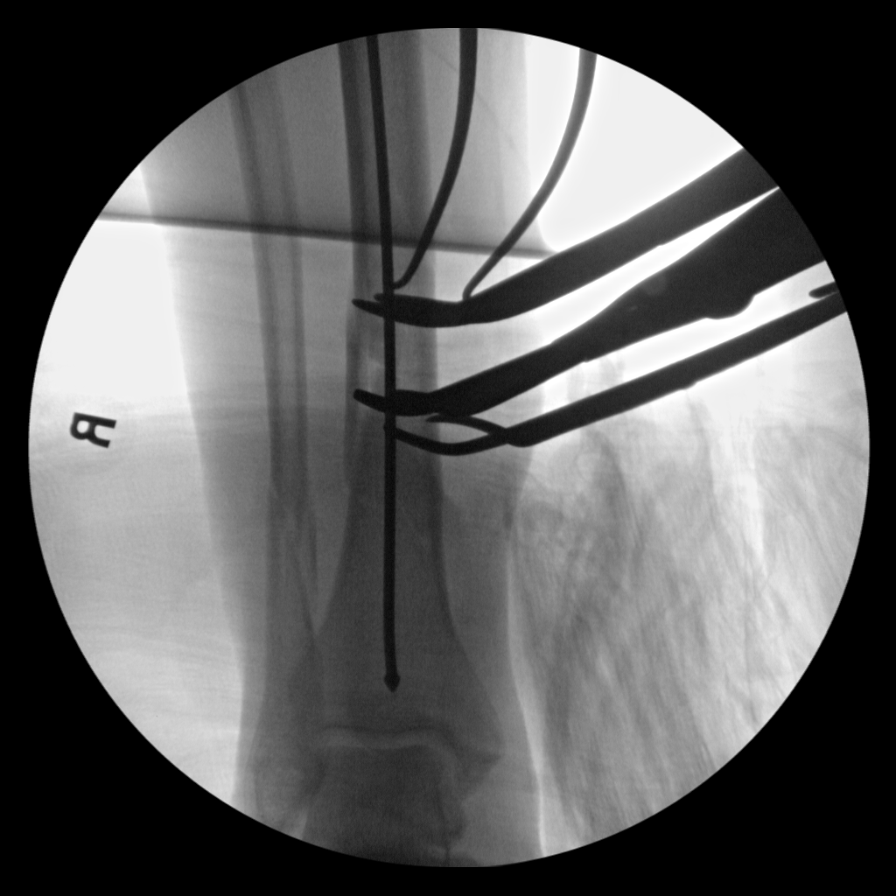
[im 3/7]
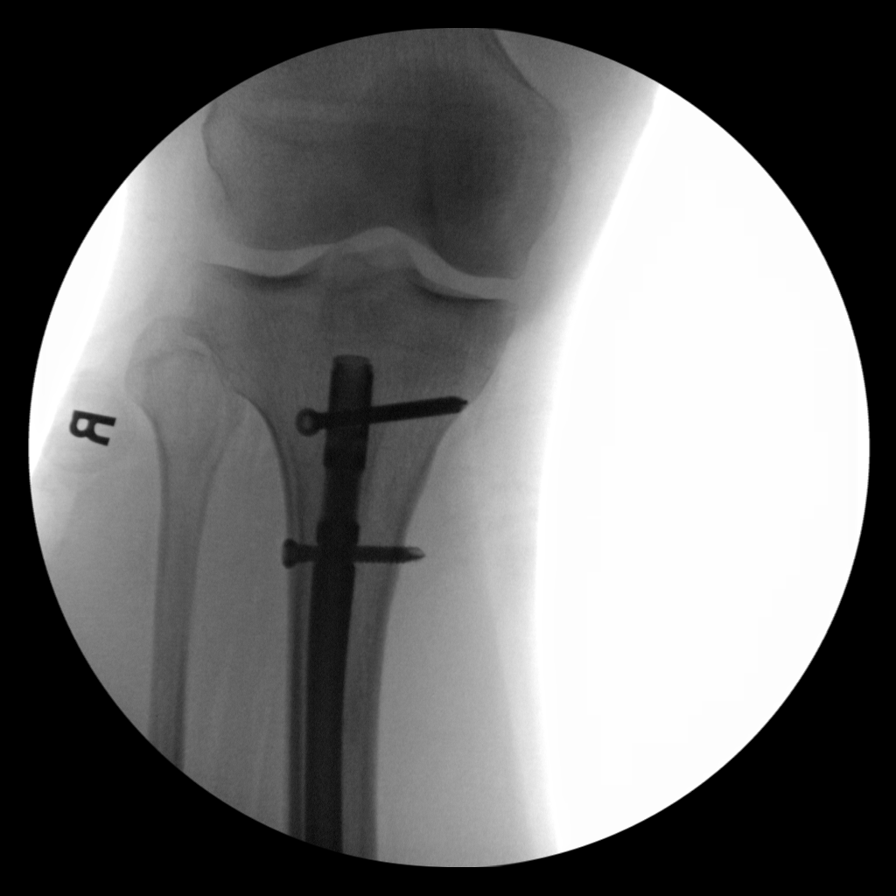
[im 4/7]
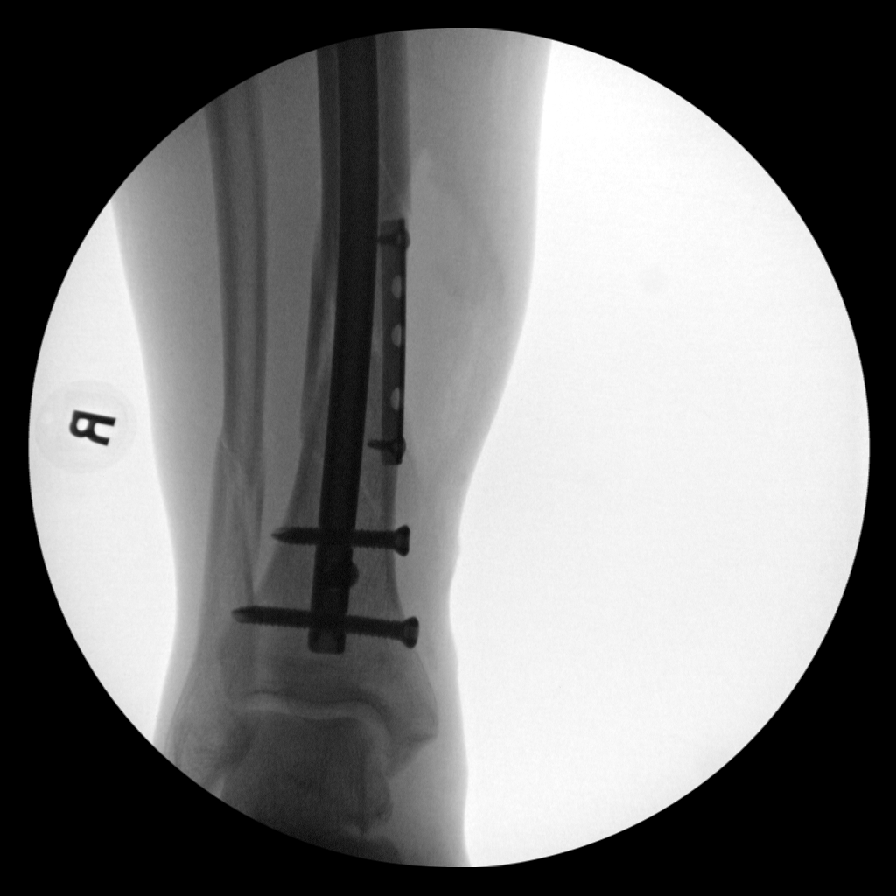
[im 5/7]
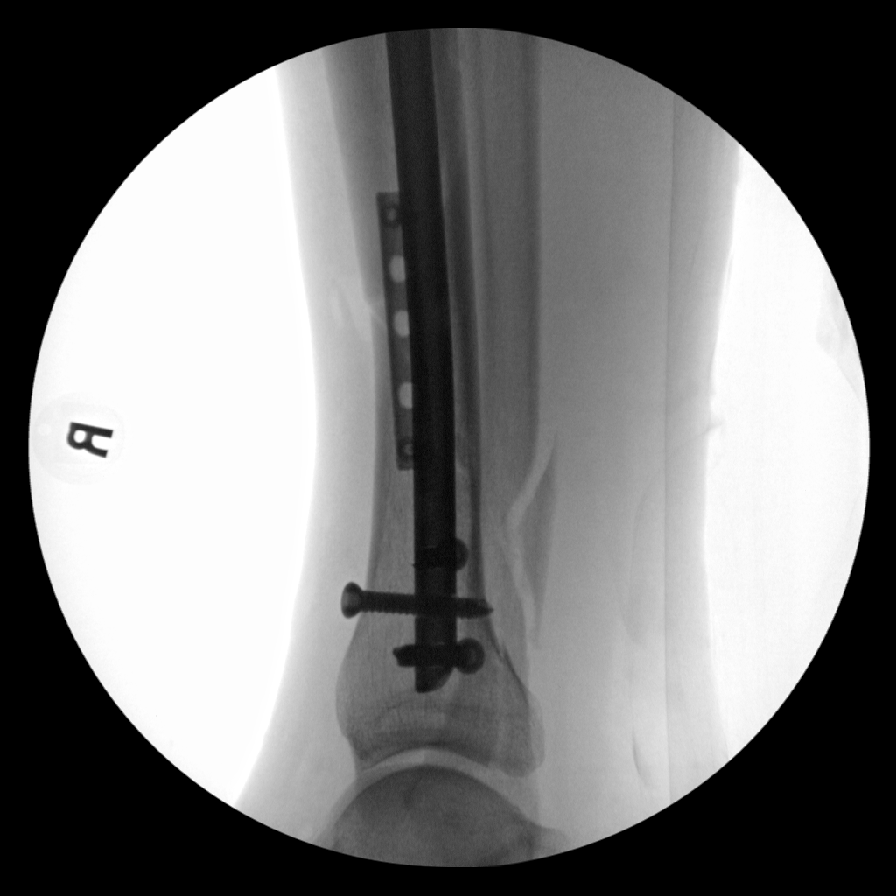
[im 6/7]
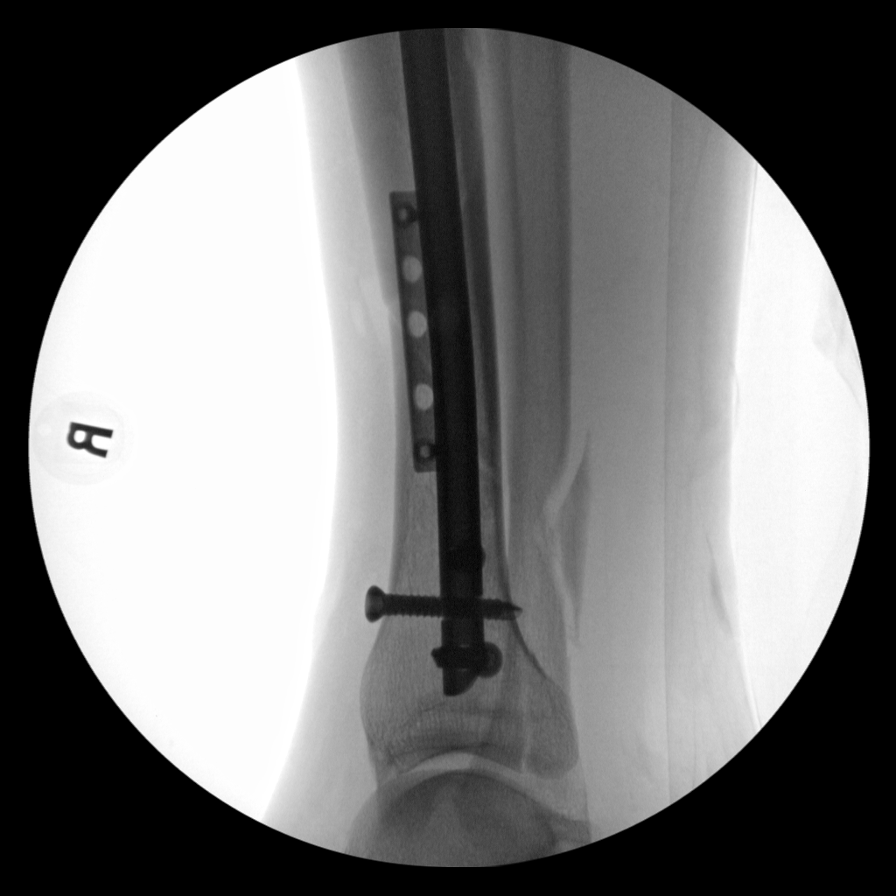
[im 7/7]
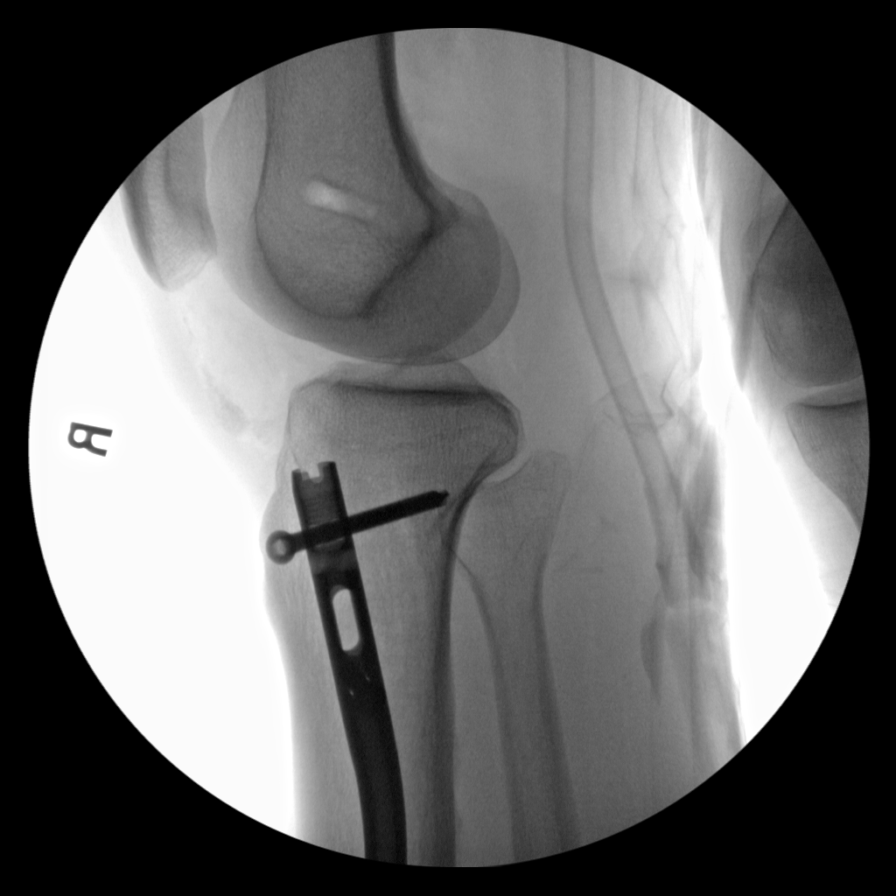

[7 of 7 positions shown; findings below may reference images not displayed]

FINDINGS: Fluoroscopic intraoperative radiographs demonstrate ORIF of a
comminuted distal tibial diaphyseal fracture with an intramedullary
rod with proximal and distal interlocking screws as well as fixation
of the prominent butterfly fragment with a medial cortical plate and
screws. Fracture fragments are in grossly anatomic alignment on this
limited examination. Fibular fracture fragments are in near anatomic
alignment with mild posterior displacement of the a butterfly
fragment. No unexpected fracture or dislocation.
IMPRESSION: Intraoperative radiographs as described above.

## 2022-05-14 ENCOUNTER — Other Ambulatory Visit: Payer: Self-pay | Admitting: Urology

## 2022-05-14 DIAGNOSIS — N5201 Erectile dysfunction due to arterial insufficiency: Secondary | ICD-10-CM

## 2022-05-15 ENCOUNTER — Other Ambulatory Visit: Payer: Self-pay | Admitting: Urology

## 2022-05-15 DIAGNOSIS — N5201 Erectile dysfunction due to arterial insufficiency: Secondary | ICD-10-CM

## 2022-05-15 MED ORDER — SILDENAFIL CITRATE 100 MG PO TABS: ORAL_TABLET | ORAL | 99 refills | Status: DC

## 2023-02-20 DIAGNOSIS — I10 Essential (primary) hypertension: Secondary | ICD-10-CM | POA: Diagnosis not present

## 2023-02-20 DIAGNOSIS — E669 Obesity, unspecified: Secondary | ICD-10-CM | POA: Diagnosis not present

## 2023-02-20 DIAGNOSIS — K219 Gastro-esophageal reflux disease without esophagitis: Secondary | ICD-10-CM | POA: Diagnosis not present

## 2023-02-20 DIAGNOSIS — N529 Male erectile dysfunction, unspecified: Secondary | ICD-10-CM | POA: Diagnosis not present

## 2023-02-20 DIAGNOSIS — F325 Major depressive disorder, single episode, in full remission: Secondary | ICD-10-CM | POA: Diagnosis not present

## 2023-02-20 DIAGNOSIS — Z6832 Body mass index (BMI) 32.0-32.9, adult: Secondary | ICD-10-CM | POA: Diagnosis not present

## 2023-02-20 DIAGNOSIS — F419 Anxiety disorder, unspecified: Secondary | ICD-10-CM | POA: Diagnosis not present

## 2023-08-20 DIAGNOSIS — Z87892 Personal history of anaphylaxis: Secondary | ICD-10-CM | POA: Diagnosis not present

## 2023-08-20 DIAGNOSIS — Z809 Family history of malignant neoplasm, unspecified: Secondary | ICD-10-CM | POA: Diagnosis not present

## 2023-08-20 DIAGNOSIS — Z818 Family history of other mental and behavioral disorders: Secondary | ICD-10-CM | POA: Diagnosis not present

## 2023-08-20 DIAGNOSIS — Z833 Family history of diabetes mellitus: Secondary | ICD-10-CM | POA: Diagnosis not present

## 2023-08-20 DIAGNOSIS — G8929 Other chronic pain: Secondary | ICD-10-CM | POA: Diagnosis not present

## 2023-08-20 DIAGNOSIS — Z8249 Family history of ischemic heart disease and other diseases of the circulatory system: Secondary | ICD-10-CM | POA: Diagnosis not present

## 2023-08-20 DIAGNOSIS — Z888 Allergy status to other drugs, medicaments and biological substances status: Secondary | ICD-10-CM | POA: Diagnosis not present

## 2023-08-20 DIAGNOSIS — Z6835 Body mass index (BMI) 35.0-35.9, adult: Secondary | ICD-10-CM | POA: Diagnosis not present

## 2023-08-20 DIAGNOSIS — I10 Essential (primary) hypertension: Secondary | ICD-10-CM | POA: Diagnosis not present

## 2023-08-20 DIAGNOSIS — N529 Male erectile dysfunction, unspecified: Secondary | ICD-10-CM | POA: Diagnosis not present

## 2023-08-20 DIAGNOSIS — K219 Gastro-esophageal reflux disease without esophagitis: Secondary | ICD-10-CM | POA: Diagnosis not present

## 2023-10-19 ENCOUNTER — Other Ambulatory Visit: Payer: Self-pay | Admitting: Urology

## 2023-10-19 DIAGNOSIS — N5201 Erectile dysfunction due to arterial insufficiency: Secondary | ICD-10-CM

## 2023-11-20 DIAGNOSIS — E669 Obesity, unspecified: Secondary | ICD-10-CM | POA: Diagnosis not present

## 2023-11-20 DIAGNOSIS — K219 Gastro-esophageal reflux disease without esophagitis: Secondary | ICD-10-CM | POA: Diagnosis not present

## 2023-11-20 DIAGNOSIS — Z7689 Persons encountering health services in other specified circumstances: Secondary | ICD-10-CM | POA: Diagnosis not present

## 2023-11-20 DIAGNOSIS — E291 Testicular hypofunction: Secondary | ICD-10-CM | POA: Diagnosis not present

## 2023-11-20 DIAGNOSIS — F900 Attention-deficit hyperactivity disorder, predominantly inattentive type: Secondary | ICD-10-CM | POA: Diagnosis not present

## 2023-11-20 DIAGNOSIS — F419 Anxiety disorder, unspecified: Secondary | ICD-10-CM | POA: Diagnosis not present

## 2023-11-20 DIAGNOSIS — I10 Essential (primary) hypertension: Secondary | ICD-10-CM | POA: Diagnosis not present

## 2023-11-28 DIAGNOSIS — J342 Deviated nasal septum: Secondary | ICD-10-CM | POA: Diagnosis not present

## 2023-11-28 DIAGNOSIS — J3489 Other specified disorders of nose and nasal sinuses: Secondary | ICD-10-CM | POA: Diagnosis not present

## 2023-11-28 DIAGNOSIS — J343 Hypertrophy of nasal turbinates: Secondary | ICD-10-CM | POA: Diagnosis not present

## 2023-11-28 DIAGNOSIS — G4733 Obstructive sleep apnea (adult) (pediatric): Secondary | ICD-10-CM | POA: Diagnosis not present

## 2023-12-06 DIAGNOSIS — B079 Viral wart, unspecified: Secondary | ICD-10-CM | POA: Diagnosis not present

## 2023-12-06 DIAGNOSIS — F900 Attention-deficit hyperactivity disorder, predominantly inattentive type: Secondary | ICD-10-CM | POA: Diagnosis not present

## 2023-12-06 DIAGNOSIS — E291 Testicular hypofunction: Secondary | ICD-10-CM | POA: Diagnosis not present

## 2023-12-09 ENCOUNTER — Other Ambulatory Visit: Payer: Self-pay | Admitting: Otolaryngology

## 2023-12-16 MED ORDER — LIDOCAINE HCL (PF) 0.5 % IJ SOLN
INTRAMUSCULAR | Status: AC
  Filled 2023-12-16: qty 100

## 2023-12-17 ENCOUNTER — Other Ambulatory Visit: Payer: Self-pay

## 2023-12-17 ENCOUNTER — Encounter (HOSPITAL_COMMUNITY): Payer: Self-pay | Admitting: Otolaryngology

## 2023-12-17 NOTE — Progress Notes (Signed)
 SDW CALL  Patient was given pre-op instructions over the phone. The opportunity was given for the patient to ask questions. No further questions asked. Patient verbalized understanding of instructions given.   PCP - Dr. Alm Heads Cardiologist - denies  PPM/ICD - denies   Chest x-ray - denies EKG - DOS Stress Test - denies ECHO - denies Cardiac Cath - denies  Sleep Study - mild sleep apnea - wears an oral device   No DM  Last dose of GLP1 agonist-  n/a GLP1 instructions:  n/a  Blood Thinner Instructions: n/a Aspirin Instructions: n/a  ERAS Protcol - clears until 0945 PRE-SURGERY Ensure or G2- n/a  COVID TEST- no   Anesthesia review: no  Patient denies shortness of breath, fever, cough and chest pain over the phone call   All instructions explained to the patient, with a verbal understanding of the material. Patient agrees to go over the instructions while at home for a better understanding.

## 2023-12-19 ENCOUNTER — Ambulatory Visit (HOSPITAL_COMMUNITY): Admitting: Certified Registered Nurse Anesthetist

## 2023-12-19 ENCOUNTER — Encounter (HOSPITAL_COMMUNITY): Payer: Self-pay | Admitting: Otolaryngology

## 2023-12-19 ENCOUNTER — Ambulatory Visit (HOSPITAL_COMMUNITY)
Admission: RE | Admit: 2023-12-19 | Discharge: 2023-12-19 | Disposition: A | Discharge status: Home / Self Care | Payer: Self-pay | Attending: Otolaryngology | Admitting: Otolaryngology

## 2023-12-19 ENCOUNTER — Encounter (HOSPITAL_COMMUNITY)
Admission: RE | Disposition: A | Discharge status: Home / Self Care | Payer: Self-pay | Source: Home / Self Care | Attending: Otolaryngology

## 2023-12-19 DIAGNOSIS — J342 Deviated nasal septum: Secondary | ICD-10-CM | POA: Diagnosis not present

## 2023-12-19 DIAGNOSIS — J3489 Other specified disorders of nose and nasal sinuses: Secondary | ICD-10-CM | POA: Diagnosis not present

## 2023-12-19 DIAGNOSIS — J343 Hypertrophy of nasal turbinates: Secondary | ICD-10-CM | POA: Insufficient documentation

## 2023-12-19 HISTORY — PX: NASAL SEPTOPLASTY W/ TURBINOPLASTY: SHX2070

## 2023-12-19 LAB — CBC
HCT: 57.4 % — ABNORMAL HIGH (ref 39.0–52.0)
Hemoglobin: 17.3 g/dL — ABNORMAL HIGH (ref 13.0–17.0)
MCH: 22.2 pg — ABNORMAL LOW (ref 26.0–34.0)
MCHC: 30.1 g/dL (ref 30.0–36.0)
MCV: 73.7 fL — ABNORMAL LOW (ref 80.0–100.0)
Platelets: 232 K/uL (ref 150–400)
RBC: 7.79 MIL/uL — ABNORMAL HIGH (ref 4.22–5.81)
RDW: 19.6 % — ABNORMAL HIGH (ref 11.5–15.5)
WBC: 9.2 K/uL (ref 4.0–10.5)
nRBC: 0 % (ref 0.0–0.2)

## 2023-12-19 LAB — BASIC METABOLIC PANEL WITH GFR
Anion gap: 12 (ref 5–15)
BUN: 21 mg/dL — ABNORMAL HIGH (ref 6–20)
CO2: 23 mmol/L (ref 22–32)
Calcium: 9.4 mg/dL (ref 8.9–10.3)
Chloride: 100 mmol/L (ref 98–111)
Creatinine, Ser: 1.07 mg/dL (ref 0.61–1.24)
GFR, Estimated: >60 mL/min (ref >60)
Glucose, Bld: 120 mg/dL — ABNORMAL HIGH (ref 70–99)
Potassium: 3.9 mmol/L (ref 3.5–5.1)
Sodium: 135 mmol/L (ref 135–145)

## 2023-12-19 SURGERY — SEPTOPLASTY, NOSE, WITH NASAL TURBINATE REDUCTION
Anesthesia: General | Site: Nose | Laterality: Bilateral

## 2023-12-19 MED ORDER — OXYCODONE HCL 5 MG/5ML PO SOLN: 5.0000 mg | Freq: Once | ORAL | Status: DC | PRN

## 2023-12-19 MED ORDER — ACETAMINOPHEN 10 MG/ML IV SOLN: 1000.0000 mg | Freq: Once | INTRAVENOUS | Status: DC | PRN

## 2023-12-19 MED ORDER — CHLORHEXIDINE GLUCONATE 0.12 % MT SOLN
15.0000 mL | Freq: Once | OROMUCOSAL | Status: AC
  Administered 2023-12-19: 15 mL via OROMUCOSAL
  Filled 2023-12-19: qty 15

## 2023-12-19 MED ORDER — ROCURONIUM BROMIDE 10 MG/ML (PF) SYRINGE
PREFILLED_SYRINGE | INTRAVENOUS | Status: AC
Start: 2023-12-19 — End: 2023-12-19
  Filled 2023-12-19: qty 10

## 2023-12-19 MED ORDER — FENTANYL CITRATE (PF) 100 MCG/2ML IJ SOLN: 25.0000 ug | INTRAMUSCULAR | Status: DC | PRN

## 2023-12-19 MED ORDER — PROPOFOL 500 MG/50ML IV EMUL
INTRAVENOUS | Status: DC | PRN
  Administered 2023-12-19: 125 ug/kg/min via INTRAVENOUS

## 2023-12-19 MED ORDER — OXYCODONE HCL 5 MG PO TABS: 5.0000 mg | ORAL_TABLET | Freq: Once | ORAL | Status: DC | PRN

## 2023-12-19 MED ORDER — ROCURONIUM BROMIDE 10 MG/ML (PF) SYRINGE
PREFILLED_SYRINGE | INTRAVENOUS | Status: DC | PRN
Start: 2023-12-19 — End: 2023-12-19
  Administered 2023-12-19: 70 mg via INTRAVENOUS

## 2023-12-19 MED ORDER — LACTATED RINGERS IV SOLN: INTRAVENOUS | Status: DC

## 2023-12-19 MED ORDER — SODIUM CHLORIDE 0.9 % IR SOLN
Status: DC | PRN
  Administered 2023-12-19: 1000 mL

## 2023-12-19 MED ORDER — DROPERIDOL 2.5 MG/ML IJ SOLN: 0.6250 mg | Freq: Once | INTRAMUSCULAR | Status: DC | PRN

## 2023-12-19 MED ORDER — PROPOFOL 10 MG/ML IV BOLUS
INTRAVENOUS | Status: DC | PRN
  Administered 2023-12-19: 200 mg via INTRAVENOUS

## 2023-12-19 MED ORDER — LABETALOL HCL 5 MG/ML IV SOLN
INTRAVENOUS | Status: AC
  Filled 2023-12-19: qty 4

## 2023-12-19 MED ORDER — PHENYLEPHRINE HCL-NACL 20-0.9 MG/250ML-% IV SOLN
INTRAVENOUS | Status: DC | PRN
  Administered 2023-12-19: 50 ug/min via INTRAVENOUS

## 2023-12-19 MED ORDER — MIDAZOLAM HCL (PF) 2 MG/2ML IJ SOLN
INTRAMUSCULAR | Status: DC | PRN
  Administered 2023-12-19: 2 mg via INTRAVENOUS

## 2023-12-19 MED ORDER — 0.9 % SODIUM CHLORIDE (POUR BTL) OPTIME
TOPICAL | Status: DC | PRN
  Administered 2023-12-19: 1000 mL

## 2023-12-19 MED ORDER — FENTANYL CITRATE (PF) 100 MCG/2ML IJ SOLN
INTRAMUSCULAR | Status: AC
  Filled 2023-12-19: qty 2

## 2023-12-19 MED ORDER — CEFAZOLIN SODIUM-DEXTROSE 2-4 GM/100ML-% IV SOLN
2.0000 g | INTRAVENOUS | Status: AC
Start: 2023-12-19 — End: 2023-12-19
  Administered 2023-12-19: 2 g via INTRAVENOUS
  Filled 2023-12-19: qty 100

## 2023-12-19 MED ORDER — LIDOCAINE-EPINEPHRINE 1 %-1:100000 IJ SOLN
INTRAMUSCULAR | Status: AC
  Filled 2023-12-19: qty 1

## 2023-12-19 MED ORDER — LACTATED RINGERS IV SOLN: INTRAVENOUS | Status: DC | PRN

## 2023-12-19 MED ORDER — LABETALOL HCL 5 MG/ML IV SOLN
5.0000 mg | INTRAVENOUS | Status: DC | PRN
  Administered 2023-12-19 (×2): 5 mg via INTRAVENOUS

## 2023-12-19 MED ORDER — MUPIROCIN 2 % EX OINT
TOPICAL_OINTMENT | CUTANEOUS | Status: DC | PRN
  Administered 2023-12-19: 1 via TOPICAL

## 2023-12-19 MED ORDER — LIDOCAINE 2% (20 MG/ML) 5 ML SYRINGE
INTRAMUSCULAR | Status: AC
Start: 2023-12-19 — End: 2023-12-19
  Filled 2023-12-19: qty 5

## 2023-12-19 MED ORDER — SODIUM CHLORIDE 0.9 % IV SOLN
0.1500 ug/kg/min | INTRAVENOUS | Status: AC
  Administered 2023-12-19: .1 ug/kg/min via INTRAVENOUS
  Filled 2023-12-19: qty 2000

## 2023-12-19 MED ORDER — ONDANSETRON HCL 4 MG/2ML IJ SOLN
INTRAMUSCULAR | Status: DC | PRN
  Administered 2023-12-19: 4 mg via INTRAVENOUS

## 2023-12-19 MED ORDER — SUGAMMADEX SODIUM 200 MG/2ML IV SOLN
INTRAVENOUS | Status: DC | PRN
  Administered 2023-12-19: 250 mg via INTRAVENOUS

## 2023-12-19 MED ORDER — ONDANSETRON HCL 4 MG/2ML IJ SOLN
INTRAMUSCULAR | Status: AC
  Filled 2023-12-19: qty 2

## 2023-12-19 MED ORDER — DEXAMETHASONE SOD PHOSPHATE PF 10 MG/ML IJ SOLN
INTRAMUSCULAR | Status: DC | PRN
  Administered 2023-12-19: 10 mg via INTRAVENOUS

## 2023-12-19 MED ORDER — FENTANYL CITRATE (PF) 100 MCG/2ML IJ SOLN
INTRAMUSCULAR | Status: DC | PRN
  Administered 2023-12-19 (×2): 50 ug via INTRAVENOUS

## 2023-12-19 MED ORDER — ORAL CARE MOUTH RINSE: 15.0000 mL | Freq: Once | OROMUCOSAL | Status: AC

## 2023-12-19 MED ORDER — PROPOFOL 10 MG/ML IV BOLUS
INTRAVENOUS | Status: AC
Start: 2023-12-19 — End: 2023-12-19
  Filled 2023-12-19: qty 20

## 2023-12-19 MED ORDER — CEPHALEXIN 500 MG PO CAPS: 500.0000 mg | ORAL_CAPSULE | Freq: Three times a day (TID) | ORAL | 0 refills | Status: AC

## 2023-12-19 MED ORDER — MIDAZOLAM HCL 2 MG/2ML IJ SOLN
INTRAMUSCULAR | Status: AC
  Filled 2023-12-19: qty 2

## 2023-12-19 MED ORDER — LIDOCAINE 2% (20 MG/ML) 5 ML SYRINGE
INTRAMUSCULAR | Status: DC | PRN
  Administered 2023-12-19: 100 mg via INTRAVENOUS

## 2023-12-19 MED ORDER — OXYMETAZOLINE HCL 0.05 % NA SOLN
NASAL | Status: DC | PRN
  Administered 2023-12-19: 1 via TOPICAL

## 2023-12-19 MED ORDER — OXYMETAZOLINE HCL 0.05 % NA SOLN
NASAL | Status: AC
  Filled 2023-12-19: qty 30

## 2023-12-19 MED ORDER — LIDOCAINE-EPINEPHRINE 1 %-1:100000 IJ SOLN
INTRAMUSCULAR | Status: DC | PRN
  Administered 2023-12-19: 7 mL

## 2023-12-19 MED ORDER — MUPIROCIN 2 % EX OINT
TOPICAL_OINTMENT | CUTANEOUS | Status: AC
  Filled 2023-12-19: qty 22

## 2023-12-19 SURGICAL SUPPLY — 26 items
BAG COUNTER SPONGE SURGICOUNT (BAG) ×1 IMPLANT
BLADE INF TURB ROT M4 2 5PK (BLADE) ×1 IMPLANT
CANISTER SUCTION 3000ML PPV (SUCTIONS) ×1 IMPLANT
DRAPE HALF SHEET 40X57 (DRAPES) ×1 IMPLANT
DRSG TELFA 3X8 NADH STRL (GAUZE/BANDAGES/DRESSINGS) ×1 IMPLANT
GLOVE BIO SURGEON STRL SZ7.5 (GLOVE) ×1 IMPLANT
GOWN STRL REUS W/ TWL LRG LVL3 (GOWN DISPOSABLE) ×2 IMPLANT
KIT BASIN OR (CUSTOM PROCEDURE TRAY) ×1 IMPLANT
KIT TURNOVER KIT B (KITS) ×1 IMPLANT
NDL HYPO 25GX1X1/2 BEV (NEEDLE) IMPLANT
NDL HYPO 25X1 1.5 SAFETY (NEEDLE) ×1 IMPLANT
NEEDLE HYPO 25GX1X1/2 BEV (NEEDLE) ×1 IMPLANT
NEEDLE HYPO 25X1 1.5 SAFETY (NEEDLE) IMPLANT
PAD ARMBOARD POSITIONER FOAM (MISCELLANEOUS) ×1 IMPLANT
PATTIES SURGICAL .5 X3 (DISPOSABLE) ×1 IMPLANT
POSITIONER HEAD DONUT 9IN (MISCELLANEOUS) IMPLANT
SOL PREP POV-IOD 4OZ 10% (MISCELLANEOUS) IMPLANT
SOLN 0.9% NACL 1000 ML (IV SOLUTION) ×1 IMPLANT
SOLN 0.9% NACL POUR BTL 1000ML (IV SOLUTION) ×1 IMPLANT
SPLINT NASAL DOYLE BI-VL (GAUZE/BANDAGES/DRESSINGS) ×1 IMPLANT
SUT CHROMIC 2 0 SH (SUTURE) IMPLANT
SUT CHROMIC 4 0 P 3 18 (SUTURE) ×1 IMPLANT
SUT CHROMIC GUT 2 0 PS 2 27 (SUTURE) ×1 IMPLANT
SUT MNCRL AB 4-0 PS2 18 (SUTURE) IMPLANT
SUT PLAIN 4 0 ~~LOC~~ 1 (SUTURE) IMPLANT
TRAY ENT MC OR (CUSTOM PROCEDURE TRAY) ×1 IMPLANT

## 2023-12-19 NOTE — Anesthesia Postprocedure Evaluation (Signed)
 Anesthesia Post Note  Patient: Alexander Stanley  Procedure(s) Performed: SEPTOPLASTY, NOSE, WITH NASAL TURBINATE REDUCTION AND REPAIR OF SEPTAL PERFORMATION (Bilateral: Nose)     Patient location during evaluation: PACU Anesthesia Type: General Level of consciousness: awake Pain management: pain level controlled Vital Signs Assessment: post-procedure vital signs reviewed and stable Respiratory status: spontaneous breathing, nonlabored ventilation and respiratory function stable Cardiovascular status: blood pressure returned to baseline and stable Postop Assessment: no apparent nausea or vomiting Anesthetic complications: no   No notable events documented.  Last Vitals:  Vitals:   12/19/23 1630 12/19/23 1645  BP: (!) 142/102 (!) 130/93  Pulse: 91 92  Resp: 19 11  Temp:  36.8 C  SpO2: 95% 96%    Last Pain:  Vitals:   12/19/23 1645  TempSrc:   PainSc: 0-No pain                 Delon Aisha Arch

## 2023-12-19 NOTE — Anesthesia Procedure Notes (Signed)
 Procedure Name: Intubation Date/Time: 12/19/2023 1:39 PM  Performed by: Mannie Krystal LABOR, CRNAPre-anesthesia Checklist: Patient identified, Emergency Drugs available, Suction available and Patient being monitored Patient Re-evaluated:Patient Re-evaluated prior to induction Oxygen Delivery Method: Circle system utilized Preoxygenation: Pre-oxygenation with 100% oxygen Induction Type: IV induction Ventilation: Mask ventilation without difficulty and Oral airway inserted - appropriate to patient size Laryngoscope Size: Mac and 4 Grade View: Grade I Tube type: Oral Tube size: 7.5 mm Number of attempts: 1 Airway Equipment and Method: Stylet and Oral airway Placement Confirmation: ETT inserted through vocal cords under direct vision, positive ETCO2 and breath sounds checked- equal and bilateral Secured at: 23 cm Tube secured with: Tape Dental Injury: Teeth and Oropharynx as per pre-operative assessment

## 2023-12-19 NOTE — Transfer of Care (Signed)
 Immediate Anesthesia Transfer of Care Note  Patient: Alexander Stanley  Procedure(s) Performed: SEPTOPLASTY, NOSE, WITH NASAL TURBINATE REDUCTION AND REPAIR OF SEPTAL PERFORMATION (Bilateral: Nose)  Patient Location: PACU  Anesthesia Type:General  Level of Consciousness: awake, alert , and oriented  Airway & Oxygen Therapy: Patient Spontanous Breathing and Patient connected to face mask oxygen  Post-op Assessment: Report given to RN and Post -op Vital signs reviewed and stable  Post vital signs: Reviewed and stable  Last Vitals:  Vitals Value Taken Time  BP 159/116 12/19/23 16:04  Temp 36.8 C 12/19/23 15:58  Pulse 97 12/19/23 16:10  Resp 17 12/19/23 16:10  SpO2 92 % 12/19/23 16:10  Vitals shown include unfiled device data.  Last Pain:  Vitals:   12/19/23 1558  TempSrc:   PainSc: Asleep         Complications: No notable events documented.

## 2023-12-19 NOTE — Anesthesia Preprocedure Evaluation (Signed)
 Anesthesia Evaluation  Patient identified by MRN, date of birth, ID band Patient awake    Reviewed: Allergy & Precautions, NPO status , Patient's Chart, lab work & pertinent test results  History of Anesthesia Complications Negative for: history of anesthetic complications  Airway Mallampati: II  TM Distance: >3 FB Neck ROM: Full    Dental no notable dental hx. (+) Teeth Intact   Pulmonary sleep apnea , neg COPD, Patient abstained from smoking.Not current smoker   Pulmonary exam normal breath sounds clear to auscultation       Cardiovascular Exercise Tolerance: Good METShypertension, Pt. on medications (-) CAD and (-) Past MI (-) dysrhythmias  Rhythm:Regular Rate:Normal - Systolic murmurs    Neuro/Psych  PSYCHIATRIC DISORDERS Anxiety     negative neurological ROS     GI/Hepatic ,GERD  Medicated and Controlled,,(+)     (-) substance abuse    Endo/Other  neg diabetes    Renal/GU negative Renal ROS     Musculoskeletal   Abdominal  (+) + obese  Peds  Hematology   Anesthesia Other Findings Past Medical History: No date: ADD (attention deficit disorder) No date: Anxiety No date: Asthma     Comment:  as teen had exersize induced asthma - no problem as               adult No date: Cough No date: GERD (gastroesophageal reflux disease) No date: History of kidney stones No date: Hypertension No date: Umbilical hernia  Reproductive/Obstetrics                              Anesthesia Physical Anesthesia Plan  ASA: 2  Anesthesia Plan: General   Post-op Pain Management: Ofirmev  IV (intra-op)*   Induction: Intravenous  PONV Risk Score and Plan: 3 and Ondansetron , Dexamethasone , TIVA, Propofol  infusion and Midazolam   Airway Management Planned: Oral ETT  Additional Equipment: None  Intra-op Plan:   Post-operative Plan: Extubation in OR  Informed Consent: I have reviewed the  patients History and Physical, chart, labs and discussed the procedure including the risks, benefits and alternatives for the proposed anesthesia with the patient or authorized representative who has indicated his/her understanding and acceptance.     Dental advisory given  Plan Discussed with: CRNA and Surgeon  Anesthesia Plan Comments: (Discussed risks of anesthesia with patient, including PONV, sore throat, lip/dental/eye damage. Rare risks discussed as well, such as cardiorespiratory and neurological sequelae, and allergic reactions. Discussed the role of CRNA in patient's perioperative care. Patient understands.)        Anesthesia Quick Evaluation

## 2023-12-19 NOTE — H&P (Signed)
 Alexander Stanley is an 58 y.o. male.   Chief Complaint: Nasal obstruction and septal perforation HPI: 58 year old male with a history of nasal obstruction who was also found to have a septal perforation.  Having limited benefit from medicine, he presents for surgical management.  Past Medical History:  Diagnosis Date   ADD (attention deficit disorder)    Anxiety    Asthma    as teen had exersize induced asthma - no problem as adult   Cough    GERD (gastroesophageal reflux disease)    History of kidney stones    Hypertension    Umbilical hernia     Past Surgical History:  Procedure Laterality Date   CARDIAC CATHETERIZATION  2011   COLONOSCOPY     CYSTO     x 2 for kidney stone   EXTERNAL FIXATION LEG Right 10/23/2019   Procedure: APPLICATION AND REMOVAL OF EXTERNAL FIXATION RIGHT  LOWER LEG;  Surgeon: Elsa Lonni SAUNDERS, MD;  Location: [facility] OR;  Service: Orthopedics;  Laterality: Right;   I & D EXTREMITY Right 10/23/2019   Procedure: IRRIGATION AND DEBRIDEMENT, TIBIA;  Surgeon: Elsa Lonni SAUNDERS, MD;  Location: [facility] OR;  Service: Orthopedics;  Laterality: Right;   INSERTION OF MESH N/A 02/19/2014   Procedure: INSERTION OF MESH;  Surgeon: Krystal Russell, MD;  Location: [facility] ORS;  Service: General;  Laterality: N/A;   TIBIA IM NAIL INSERTION Right 10/23/2019   Procedure: INTRAMEDULLARY (IM) NAIL TIBIAL;  Surgeon: Elsa Lonni SAUNDERS, MD;  Location: [facility] OR;  Service: Orthopedics;  Laterality: Right;   TONSILLECTOMY     UMBILICAL HERNIA REPAIR N/A 02/19/2014   Procedure: HERNIA REPAIR UMBILICAL ADULT WITH MESH;  Surgeon: Krystal Russell, MD;  Location: [facility] ORS;  Service: General;  Laterality: N/A;   WISDOM TOOTH EXTRACTION      Family History  Problem Relation Age of Onset   Diabetes Mother    Diabetes Father    Colon cancer Neg Hx    Social History:  reports that he has never smoked. He has never used smokeless tobacco. He reports that he does not drink alcohol and does not use  drugs.  Allergies:  Allergies  Allergen Reactions   Effexor [Venlafaxine] Anaphylaxis    felt like was having a heart attack   Kiwi Extract Anaphylaxis   Oysters [Shellfish Allergy] Nausea And Vomiting    Facility-Administered Medications Prior to Admission  Medication Dose Route Frequency Provider Last Rate Last Admin   0.9 %  sodium chloride  infusion  500 mL Intravenous Continuous Nandigam, Kavitha V, MD       Medications Prior to Admission  Medication Sig Dispense Refill   amphetamine-dextroamphetamine (ADDERALL) 30 MG tablet Take 30 mg by mouth daily.     diazepam (VALIUM) 5 MG tablet Take 5 mg by mouth every 6 (six) hours as needed for anxiety.     hydrochlorothiazide (HYDRODIURIL) 50 MG tablet Take 50 mg by mouth daily.     losartan (COZAAR) 100 MG tablet Take 100 mg by mouth daily.     montelukast (SINGULAIR) 10 MG tablet Take 10 mg by mouth at bedtime.     Multiple Vitamin (MULTIVITAMIN WITH MINERALS) TABS tablet Take 1 tablet by mouth daily.     pantoprazole (PROTONIX) 40 MG tablet Take 40 mg by mouth daily.     sildenafil  (VIAGRA ) 100 MG tablet TAKE 1/2 TO 1 (ONE-HALF TO ONE) TABLET BY MOUTH AS NEEDED 30 tablet 0   sodium chloride  (OCEAN) 0.65 %  SOLN nasal spray Place 1 spray into both nostrils daily as needed for congestion.     testosterone cypionate (DEPOTESTOSTERONE CYPIONATE) 200 MG/ML injection Inject 800 mg into the muscle every 14 (fourteen) days.      Results for orders placed or performed during the hospital encounter of 12/19/23 (from the past 48 hours)  Basic metabolic panel per protocol     Status: Abnormal   Collection Time: 12/19/23 11:43 AM  Result Value Ref Range   Sodium 135 135 - 145 mmol/L   Potassium 3.9 3.5 - 5.1 mmol/L   Chloride 100 98 - 111 mmol/L   CO2 23 22 - 32 mmol/L   Glucose, Bld 120 (H) 70 - 99 mg/dL    Comment: Glucose reference range applies only to samples taken after fasting for at least 8 hours.   BUN 21 (H) 6 - 20 mg/dL    Creatinine, Ser 8.92 0.61 - 1.24 mg/dL   Calcium 9.4 8.9 - 89.6 mg/dL   GFR, Estimated >39 >39 mL/min    Comment: (NOTE) Calculated using the CKD-EPI Creatinine Equation (2021)    Anion gap 12 5 - 15    Comment: Performed at [facility], [address] [place], [place] [address]  CBC per protocol     Status: Abnormal   Collection Time: 12/19/23 11:43 AM  Result Value Ref Range   WBC 9.2 4.0 - 10.5 K/uL   RBC 7.79 (H) 4.22 - 5.81 MIL/uL   Hemoglobin 17.3 (H) 13.0 - 17.0 g/dL   HCT 42.5 (H) 60.9 - 47.9 %   MCV 73.7 (L) 80.0 - 100.0 fL   MCH 22.2 (L) 26.0 - 34.0 pg   MCHC 30.1 30.0 - 36.0 g/dL   RDW 80.3 (H) 88.4 - 84.4 %   Platelets 232 150 - 400 K/uL   nRBC 0.0 0.0 - 0.2 %    Comment: Performed at [facility], [address] [place], [place] [address]   No results found.  Review of Systems  All other systems reviewed and are negative.   Blood pressure (!) 161/121, pulse 92, temperature 98.3 F (36.8 C), temperature source Oral, resp. rate 18, height 6' 1 (1.854 m), weight 117.9 kg, SpO2 94%. Physical Exam Constitutional:      Appearance: Normal appearance. He is normal weight.  HENT:     Head: Normocephalic and atraumatic.     Right Ear: External ear normal.     Left Ear: External ear normal.     Nose:     Comments: Penny-sized anterior septal perforation and a spur to the left inferiorly.  Turbinates moderate.    Mouth/Throat:     Mouth: Mucous membranes are moist.     Pharynx: Oropharynx is clear.  Eyes:     Extraocular Movements: Extraocular movements intact.     Pupils: Pupils are equal, round, and reactive to light.  Cardiovascular:     Rate and Rhythm: Normal rate.  Pulmonary:     Effort: Pulmonary effort is normal.  Skin:    General: Skin is warm and dry.  Neurological:     General: No focal deficit present.     Mental Status: He is alert and oriented to person, place, and time.  Psychiatric:        Mood and Affect: Mood normal.         Behavior: Behavior normal.        Thought Content: Thought content normal.  Judgment: Judgment normal.      Assessment/Plan Septal perforation, septal deviation, and turbinate reduction  To OR for septoplasty, flap reconstruction of the septal perforation, and turbinate reduction.  Vaughan Ricker, MD 12/19/2023, 1:00 PM

## 2023-12-19 NOTE — Progress Notes (Signed)
 Spoke to Dr. Boone regarding patient's elevated B/P.  Will continue to monitor.

## 2023-12-19 NOTE — Op Note (Signed)
 PREOPERATIVE DIAGNOSIS:  Septal deviation, septal perforation, and turbinate hypertrophy   POSTOPERATIVE DIAGNOSIS:  Septal deviation, septal perforation, and turbinate hypertrophy   PROCEDURE:  Septoplasty, septal perforation repair, and bilateral turbinate reduction   SURGEON:  Vaughan Ricker, MD   ANESTHESIA:  General endotracheal anesthesia   COMPLICATIONS:  None   INDICATIONS:  The patient is a 58 year old male with a history of nasal obstruction due to septal deviation, turbinate hypertrophy, and was found to have a penny-sized anterior perforation that has not responeded to medical therapy.  He presents to the operating room for surgical management.   FINDINGS:  Penny-sized anterior septal perforation.  Septal deviation to left posteriorly.  Caudal septal cartilage crowds right nasal ala.  Turbinates enlarged.   DESCRIPTION OF PROCEDURE:  The patient was identified in the holding room, informed consent having been obtained including discussion of risks, benefits and alternatives, the patient was brought to the operative suite and put the operative table in supine position.  Anesthesia was induced and the patient was intubated by the anesthesia team without difficulty.  The eyes were taped closed.  The patient was given intravenous antibiotics during the case.  The face was prepped and draped in sterile fashion.   The septum was then injected with local anesthetic on both sides.  A left-sided Killian incision was made and the mucoperichondral flap was elevated down the left side including elevating the mucosa from the perforation in the cartilage.  The bony-cartilaginous junction was divided and the inferior septal cartilage was removed from the leftward spur.  Posterior bone was partially removed and an osteotome was used to remove the inferior bone. The septum laid in the midline.  Soft tissues were redraped.  On the left side, a superior septal mucosal flap was developed by making a curved  incision as superior as possible through the septal flap and extending anterior to the perforation as it curved down.  The flap was mobilized and able to be rotated down over the perforation.  It was sutured in place using 4-0 chromic.  On the right side, an inferior septal mucosal flap was developed including mucosa of the floor of the nose.  The incision was made inferior to the inferior turbinate and then curving anterior to the perforation in the inferior nasal vestibule.  The flap was elevated from the floor of the nose and inferior septum.  It was not as robust of a flap.  It was rotated superiorly.  Several attempts were made to suture it in place that were not helpful.  The flap was laid into the perforation.  At this point, the inferior turbinates were injected with local anesthetic.  An incision was made at the anterior head of each inferior turbinate and soft tissues were elevated off of the underlying bone using a freer elevator.  The microdebrider was then used with the turbinate blade to remove submucosal tissue keeping the overlying mucosa and underlying bone intact.  Soft tissues were then redraped and the turbinates out-fractured.  The nasal passage was suctioned.    Doyle stents coated in mupirocin ointment were then placed in both nasal passages, holding the right-sided flap in position.  The left-sided Killian incision was closed with 4-0 chromic in a simple, interrupted fashion.  Stents were positioned and secured with a through-and-through vertical mattress suture using 2-0 Vicryl.  Nasal passages and the throat were suctioned.   Drapes were removed and the patient was cleaned off.  The patient was returned to anesthesia for  wakeup, extubated, and taken to the recovery room in stable condition.

## 2023-12-20 ENCOUNTER — Encounter (HOSPITAL_COMMUNITY): Payer: Self-pay | Admitting: Otolaryngology

## 2023-12-24 DIAGNOSIS — Z48810 Encounter for surgical aftercare following surgery on the sense organs: Secondary | ICD-10-CM | POA: Diagnosis not present

## 2023-12-24 DIAGNOSIS — Z4689 Encounter for fitting and adjustment of other specified devices: Secondary | ICD-10-CM | POA: Diagnosis not present

## 2023-12-25 DIAGNOSIS — R040 Epistaxis: Secondary | ICD-10-CM | POA: Diagnosis not present

## 2023-12-25 DIAGNOSIS — J342 Deviated nasal septum: Secondary | ICD-10-CM | POA: Diagnosis not present

## 2023-12-25 DIAGNOSIS — Z48810 Encounter for surgical aftercare following surgery on the sense organs: Secondary | ICD-10-CM | POA: Diagnosis not present

## 2023-12-25 DIAGNOSIS — J343 Hypertrophy of nasal turbinates: Secondary | ICD-10-CM | POA: Diagnosis not present

## 2023-12-25 DIAGNOSIS — J3489 Other specified disorders of nose and nasal sinuses: Secondary | ICD-10-CM | POA: Diagnosis not present

## 2024-01-07 DIAGNOSIS — J343 Hypertrophy of nasal turbinates: Secondary | ICD-10-CM | POA: Diagnosis not present

## 2024-01-07 DIAGNOSIS — J342 Deviated nasal septum: Secondary | ICD-10-CM | POA: Diagnosis not present

## 2024-01-07 DIAGNOSIS — J3489 Other specified disorders of nose and nasal sinuses: Secondary | ICD-10-CM | POA: Diagnosis not present

## 2024-01-07 DIAGNOSIS — Z48810 Encounter for surgical aftercare following surgery on the sense organs: Secondary | ICD-10-CM | POA: Diagnosis not present

## 2024-02-14 DIAGNOSIS — J3089 Other allergic rhinitis: Secondary | ICD-10-CM | POA: Diagnosis not present

## 2024-02-14 DIAGNOSIS — J301 Allergic rhinitis due to pollen: Secondary | ICD-10-CM | POA: Diagnosis not present

## 2024-02-14 DIAGNOSIS — J3081 Allergic rhinitis due to animal (cat) (dog) hair and dander: Secondary | ICD-10-CM | POA: Diagnosis not present
